# Patient Record
Sex: Female | Born: 1990 | Race: White | Hispanic: No | Marital: Single | Smoking: Never smoker
Health system: Southern US, Community
[De-identification: ages and names within clinical notes are randomized; demographics above are authoritative.]

## PROBLEM LIST (undated history)

## (undated) DIAGNOSIS — F4310 Post-traumatic stress disorder, unspecified: Secondary | ICD-10-CM

## (undated) DIAGNOSIS — R000 Tachycardia, unspecified: Secondary | ICD-10-CM

## (undated) DIAGNOSIS — C801 Malignant (primary) neoplasm, unspecified: Secondary | ICD-10-CM

## (undated) DIAGNOSIS — I10 Essential (primary) hypertension: Secondary | ICD-10-CM

## (undated) DIAGNOSIS — Z9889 Other specified postprocedural states: Secondary | ICD-10-CM

## (undated) DIAGNOSIS — Z9289 Personal history of other medical treatment: Secondary | ICD-10-CM

## (undated) DIAGNOSIS — F329 Major depressive disorder, single episode, unspecified: Secondary | ICD-10-CM

## (undated) DIAGNOSIS — F411 Generalized anxiety disorder: Secondary | ICD-10-CM

## (undated) DIAGNOSIS — E119 Type 2 diabetes mellitus without complications: Secondary | ICD-10-CM

## (undated) DIAGNOSIS — K760 Fatty (change of) liver, not elsewhere classified: Secondary | ICD-10-CM

## (undated) HISTORY — PX: SKIN CANCER EXCISION: SHX779

## (undated) HISTORY — PX: ABLATION: SHX5711

## (undated) HISTORY — PX: APPENDECTOMY: SHX54

---

## 2020-04-20 ENCOUNTER — Other Ambulatory Visit: Payer: Self-pay

## 2020-04-20 ENCOUNTER — Ambulatory Visit
Admission: EM | Admit: 2020-04-20 | Discharge: 2020-04-20 | Disposition: A | Discharge status: Home / Self Care | Payer: Self-pay

## 2020-04-20 ENCOUNTER — Encounter: Payer: Self-pay | Admitting: *Deleted

## 2020-04-20 DIAGNOSIS — R112 Nausea with vomiting, unspecified: Secondary | ICD-10-CM | POA: Insufficient documentation

## 2020-04-20 LAB — POCT URINALYSIS DIP (MANUAL ENTRY)
Glucose, UA: NEGATIVE mg/dL
Ketones, POC UA: NEGATIVE mg/dL
Nitrite, UA: NEGATIVE
Spec Grav, UA: >=1.03 — AB (ref 1.010–1.025)
Urobilinogen, UA: 0.2 E.U./dL
pH, UA: 6 (ref 5.0–8.0)

## 2020-04-20 MED ORDER — ONDANSETRON HCL 4 MG/2ML IJ SOLN
4.0000 mg | Freq: Once | INTRAMUSCULAR | Status: AC
  Administered 2020-04-20: 4 mg via INTRAMUSCULAR

## 2020-04-20 MED ORDER — ONDANSETRON 4 MG PO TBDP: 4.0000 mg | ORAL_TABLET | Freq: Three times a day (TID) | ORAL | 0 refills | Status: DC | PRN

## 2020-04-20 NOTE — ED Provider Notes (Signed)
[facility]    CSN: [id number] Arrival date & time: 04/20/20  1335      History   Chief Complaint Chief Complaint  Patient presents with  . Emesis    lower abdominal pain both sides, sore throat 2 days  . Chest Pain    HPI Mackenzie Whitaker is a 29 y.o. female.   29 year old female comes in for 2 day history of nausea, vomiting. States has had 20+ NBNB vomiting. Had diarrhea that has since resolved. Some dysuria without frequency, hematuria. No vaginal symptoms. Some abdominal pain associated with vomiting. Denies URI symptoms. No fever. LMP 03/31/2020, not currently sexually active.     History reviewed. No pertinent past medical history.  There are no problems to display for this patient.   Past Surgical History:  Procedure Laterality Date  . APPENDECTOMY      OB History   No obstetric history on file.      Home Medications    Prior to Admission medications   Medication Sig Start Date End Date Taking? Authorizing Provider  busPIRone (BUSPAR) 10 MG tablet Take 10 mg by mouth 2 (two) times daily.   Yes [provider]  citalopram (CELEXA) 20 MG tablet Take 20 mg by mouth daily.   Yes [provider]  ondansetron (ZOFRAN ODT) 4 MG disintegrating tablet Take 1 tablet (4 mg total) by mouth every 8 (eight) hours as needed for nausea or vomiting. 04/20/20   Ok Edwards, PA-C    Family History History reviewed. No pertinent family history.  Social History Social History   Tobacco Use  . Smoking status: Never Smoker  . Smokeless tobacco: Never Used  Vaping Use  . Vaping Use: Every day  Substance Use Topics  . Alcohol use: Never  . Drug use: Never     Allergies   Amoxicillin, Clindamycin/lincomycin, Wellbutrin [bupropion], and Zoloft [sertraline]   Review of Systems Review of Systems  Reason unable to perform ROS: See HPI as above.     Physical Exam Triage Vital Signs ED Triage Vitals  Enc Vitals Group     BP 04/20/20  1351 (!) 150/116     Pulse Rate 04/20/20 1351 (!) 118     Resp 04/20/20 1351 14     Temp 04/20/20 1351 98.1 F (36.7 C)     Temp Source 04/20/20 1351 Oral     SpO2 04/20/20 1351 97 %     Weight --      Height --      Head Circumference --      Peak Flow --      Pain Score 04/20/20 1355 3     Pain Loc --      Pain Edu? --      Excl. in [place]? --    No data found.  Updated Vital Signs BP (!) 150/116   Pulse (!) 118   Temp 98.1 F (36.7 C) (Oral)   Resp 14   LMP 03/31/2020   SpO2 97%   Physical Exam Constitutional:      General: She is not in acute distress.    Appearance: She is well-developed. She is not ill-appearing, toxic-appearing or diaphoretic.  HENT:     Head: Normocephalic and atraumatic.  Eyes:     Conjunctiva/sclera: Conjunctivae normal.     Pupils: Pupils are equal, round, and reactive to light.  Cardiovascular:     Rate and Rhythm: Regular rhythm. Tachycardia present.  Pulmonary:  Effort: Pulmonary effort is normal. No respiratory distress.     Comments: LCTAB Abdominal:     General: Bowel sounds are normal.     Palpations: Abdomen is soft.     Tenderness: There is no abdominal tenderness. There is no right CVA tenderness, left CVA tenderness, guarding or rebound.  Musculoskeletal:     Cervical back: Normal range of motion and neck supple.  Skin:    General: Skin is warm and dry.  Neurological:     Mental Status: She is alert and oriented to person, place, and time.  Psychiatric:        Behavior: Behavior normal.        Judgment: Judgment normal.      UC Treatments / Results  Labs (all labs ordered are listed, but only abnormal results are displayed) Labs Reviewed  POCT URINALYSIS DIP (MANUAL ENTRY) - Abnormal; Notable for the following components:      Result Value   Clarity, UA cloudy (*)    Bilirubin, UA small (*)    Spec Grav, UA >=1.030 (*)    Blood, UA trace-intact (*)    Protein Ur, POC trace (*)    Leukocytes, UA Large (3+) (*)      All other components within normal limits  URINE CULTURE    EKG   Radiology No results found.  Procedures Procedures (including critical care time)  Medications Ordered in UC Medications  ondansetron (ZOFRAN) injection 4 mg (4 mg Intramuscular Given 04/20/20 1457)  ondansetron (ZOFRAN) injection 4 mg (4 mg Intramuscular Given 04/20/20 1530)    Initial Impression / Assessment and Plan / UC Course  I have reviewed the triage vital signs and the nursing notes.  Pertinent labs & imaging results that were available during my care of the patient were reviewed by me and considered in my medical decision making (see chart for details).    Zofran 4mg  IM for symptoms without significant relief. Will attempt zofran 4mg  IM x 2, patient would like to rest at home. Urine dipstick with trace blood and large leuks. Will send for urine culture prior to treatment. Discussed if still unable to tolerate fluid intake, to go to the ED for further evaluation. Otherwise, zofran called in for further management if needed. Return precautions given.  Final Clinical Impressions(s) / UC Diagnoses   Final diagnoses:  Non-intractable vomiting with nausea, unspecified vomiting type   ED Prescriptions    Medication Sig Dispense Auth. Provider   ondansetron (ZOFRAN ODT) 4 MG disintegrating tablet Take 1 tablet (4 mg total) by mouth every 8 (eight) hours as needed for nausea or vomiting. 20 tablet Ok Edwards, PA-C     PDMP not reviewed this encounter.   Ok Edwards, PA-C 04/20/20 1608

## 2020-04-20 NOTE — ED Triage Notes (Signed)
Pt stated --vomiting and chest pain started 2 days ago. Tried drinking ginger ale not helping.

## 2020-04-20 NOTE — Discharge Instructions (Signed)
2 shots of zofran. If this starts working, you can continue zofran as needed. If still unable to keep anything down, go to the emergency department for further evaluation.

## 2020-04-24 LAB — URINE CULTURE: Culture: >=100000 — AB

## 2020-04-26 ENCOUNTER — Other Ambulatory Visit: Payer: Self-pay

## 2020-04-26 ENCOUNTER — Encounter (HOSPITAL_COMMUNITY): Payer: Self-pay | Admitting: Emergency Medicine

## 2020-04-26 ENCOUNTER — Emergency Department (HOSPITAL_COMMUNITY)
Admission: EM | Admit: 2020-04-26 | Discharge: 2020-04-27 | Disposition: A | Discharge status: Home / Self Care | Payer: Self-pay | Attending: Emergency Medicine | Admitting: Emergency Medicine

## 2020-04-26 DIAGNOSIS — R000 Tachycardia, unspecified: Secondary | ICD-10-CM | POA: Insufficient documentation

## 2020-04-26 DIAGNOSIS — F1210 Cannabis abuse, uncomplicated: Secondary | ICD-10-CM

## 2020-04-26 DIAGNOSIS — T50901A Poisoning by unspecified drugs, medicaments and biological substances, accidental (unintentional), initial encounter: Secondary | ICD-10-CM | POA: Insufficient documentation

## 2020-04-26 DIAGNOSIS — Z20822 Contact with and (suspected) exposure to covid-19: Secondary | ICD-10-CM | POA: Insufficient documentation

## 2020-04-26 MED ORDER — SODIUM CHLORIDE 0.9 % IV BOLUS
1000.0000 mL | Freq: Once | INTRAVENOUS | Status: AC
  Administered 2020-04-27: 1000 mL via INTRAVENOUS

## 2020-04-26 MED ORDER — LORAZEPAM 2 MG/ML IJ SOLN
1.0000 mg | Freq: Once | INTRAMUSCULAR | Status: AC
  Administered 2020-04-27: 1 mg via INTRAVENOUS
  Filled 2020-04-26: qty 1

## 2020-04-26 NOTE — ED Triage Notes (Signed)
Pt presents to ED from home BIB [facility]. Per pt she was smoking a THC vape, and that she was told by family she passed out, was tachycardic and HTN. Pt found in SVT by EMS highest rate 170s. EMS give 12mg  adenosine and 14ml IVF. BP - 160/80. Hx of same in past

## 2020-04-26 NOTE — ED Provider Notes (Signed)
[facility] EMERGENCY DEPARTMENT Provider Note   CSN: [id number] Arrival date & time: 04/26/20  2228     History Chief Complaint  Patient presents with  . Tachycardia    Mackenzie Whitaker is a 29 y.o. female.  Patient presents to the emergency department with a chief complaint of potential overdose.  She states that she received some bad news today, and took 7 "hits of [place]."  She states that shortly thereafter she passed out and reportedly had some convulsions.  She does not recall this.  She did not have any history of seizure.  EMS was called, and patient had heart rate in the 170s.  EMS gave 12 mg of adenosine.  Patient states that she has history of inappropriate sinus tachycardia and takes metoprolol, but has been out of her medication.  She states that she still feels anxious.  She denies any other associated symptoms.  She is noted to have mildly elevated temperature in triage of 100.1.  Denies suicidal thoughts, but states that she used the vape to "help with anxiety."  The history is provided by the patient. No language interpreter was used.       History reviewed. No pertinent past medical history.  There are no problems to display for this patient.   Past Surgical History:  Procedure Laterality Date  . APPENDECTOMY       OB History   No obstetric history on file.     History reviewed. No pertinent family history.  Social History   Tobacco Use  . Smoking status: Never Smoker  . Smokeless tobacco: Never Used  Vaping Use  . Vaping Use: Every day  Substance Use Topics  . Alcohol use: Never  . Drug use: Never    Home Medications Prior to Admission medications   Medication Sig Start Date End Date Taking? Authorizing Provider  metoprolol succinate (TOPROL-XL) 100 MG 24 hr tablet Take 100 mg by mouth every evening. Take with or immediately following a meal.   Yes [provider]  metoprolol tartrate (LOPRESSOR) 50 MG tablet Take  50 mg by mouth every morning.   Yes [provider]  busPIRone (BUSPAR) 10 MG tablet Take 10 mg by mouth 2 (two) times daily. Patient not taking: Reported on 04/26/2020    [provider]  citalopram (CELEXA) 20 MG tablet Take 20 mg by mouth daily. Patient not taking: Reported on 04/26/2020    [provider]  ondansetron (ZOFRAN ODT) 4 MG disintegrating tablet Take 1 tablet (4 mg total) by mouth every 8 (eight) hours as needed for nausea or vomiting. Patient not taking: Reported on 04/26/2020 04/20/20   Ok Edwards, PA-C    Allergies    Albuterol, Amoxicillin, Clindamycin/lincomycin, Other, Wellbutrin [bupropion], and Zoloft [sertraline]  Review of Systems   Review of Systems  All other systems reviewed and are negative.   Physical Exam Updated Vital Signs BP (!) 153/112   Pulse (!) 110   Temp 100.1 F (37.8 C) (Oral)   Resp 15   Ht 5\' 5"  (1.651 m)   Wt 127 kg   LMP 03/31/2020   SpO2 100%   BMI 46.59 kg/m   Physical Exam Vitals and nursing note reviewed.  Constitutional:      General: She is not in acute distress.    Appearance: She is well-developed.  HENT:     Head: Normocephalic and atraumatic.  Eyes:     Conjunctiva/sclera: Conjunctivae normal.  Cardiovascular:  Rate and Rhythm: Regular rhythm. Tachycardia present.     Heart sounds: No murmur heard.   Pulmonary:     Effort: Pulmonary effort is normal. No respiratory distress.     Breath sounds: Normal breath sounds.  Abdominal:     Palpations: Abdomen is soft.     Tenderness: There is no abdominal tenderness.  Musculoskeletal:        General: Normal range of motion.     Cervical back: Neck supple.  Skin:    General: Skin is warm and dry.  Neurological:     Mental Status: She is alert.  Psychiatric:     Comments: tearful     ED Results / Procedures / Treatments   Labs (all labs ordered are listed, but only abnormal results are displayed) Labs Reviewed  RESPIRATORY PANEL BY  RT PCR (FLU A&B, COVID)  COMPREHENSIVE METABOLIC PANEL  SALICYLATE LEVEL  ACETAMINOPHEN LEVEL  ETHANOL  RAPID URINE DRUG SCREEN, HOSP PERFORMED  CBC WITH DIFFERENTIAL/PLATELET  CBG MONITORING, ED  I-STAT BETA HCG BLOOD, ED ([facility], [facility], [facility] ONLY)    EKG None  Radiology No results found.  Procedures Procedures (including critical care time)  Medications Ordered in ED Medications  sodium chloride 0.9 % bolus 1,000 mL (has no administration in time range)  LORazepam (ATIVAN) injection 1 mg (has no administration in time range)    ED Course  I have reviewed the triage vital signs and the nursing notes.  Pertinent labs & imaging results that were available during my care of the patient were reviewed by me and considered in my medical decision making (see chart for details).    MDM Rules/Calculators/A&P                          Patient here with tachycardia and questionable seizure after using a THC vape.  Will check for coingestants.  Will give fluids and some Ativan.  Anticipate metabolizing to freedom.  Patient is overall well-appearing and nontoxic.  Given mildly elevated temperature, consider checking Covid.  2:40 AM Patient reassessed. Feeling improved.  HR coming down. Will refill patient's metoprolol.  Labs are reassuring.  No further emergent workup needed.  Final Clinical Impression(s) / ED Diagnoses Final diagnoses:  None    Rx / DC Orders ED Discharge Orders    None       Montine Circle, PA-C 04/27/20 0240    Fatima Blank, MD 04/27/20 [phone]

## 2020-04-27 LAB — RAPID URINE DRUG SCREEN, HOSP PERFORMED
Amphetamines: NOT DETECTED
Barbiturates: NOT DETECTED
Benzodiazepines: NOT DETECTED
Cocaine: NOT DETECTED
Opiates: NOT DETECTED
Tetrahydrocannabinol: POSITIVE — AB

## 2020-04-27 LAB — CBC WITH DIFFERENTIAL/PLATELET
Abs Immature Granulocytes: 0.06 10*3/uL (ref 0.00–0.07)
Basophils Absolute: 0.1 10*3/uL (ref 0.0–0.1)
Basophils Relative: 0 %
Eosinophils Absolute: 0 10*3/uL (ref 0.0–0.5)
Eosinophils Relative: 0 %
HCT: 47.5 % — ABNORMAL HIGH (ref 36.0–46.0)
Hemoglobin: 15.9 g/dL — ABNORMAL HIGH (ref 12.0–15.0)
Immature Granulocytes: 1 %
Lymphocytes Relative: 14 %
Lymphs Abs: 1.6 10*3/uL (ref 0.7–4.0)
MCH: 28.3 pg (ref 26.0–34.0)
MCHC: 33.5 g/dL (ref 30.0–36.0)
MCV: 84.5 fL (ref 80.0–100.0)
Monocytes Absolute: 0.7 10*3/uL (ref 0.1–1.0)
Monocytes Relative: 6 %
Neutro Abs: 9 10*3/uL — ABNORMAL HIGH (ref 1.7–7.7)
Neutrophils Relative %: 79 %
Platelets: 276 10*3/uL (ref 150–400)
RBC: 5.62 MIL/uL — ABNORMAL HIGH (ref 3.87–5.11)
RDW: 12.5 % (ref 11.5–15.5)
WBC: 11.4 10*3/uL — ABNORMAL HIGH (ref 4.0–10.5)
nRBC: 0 % (ref 0.0–0.2)

## 2020-04-27 LAB — ETHANOL: Alcohol, Ethyl (B): <10 mg/dL (ref <10)

## 2020-04-27 LAB — COMPREHENSIVE METABOLIC PANEL
ALT: 75 U/L — ABNORMAL HIGH (ref 0–44)
AST: 60 U/L — ABNORMAL HIGH (ref 15–41)
Albumin: 3.7 g/dL (ref 3.5–5.0)
Alkaline Phosphatase: 86 U/L (ref 38–126)
Anion gap: 12 (ref 5–15)
BUN: 12 mg/dL (ref 6–20)
CO2: 24 mmol/L (ref 22–32)
Calcium: 10.1 mg/dL (ref 8.9–10.3)
Chloride: 105 mmol/L (ref 98–111)
Creatinine, Ser: 0.87 mg/dL (ref 0.44–1.00)
GFR calc Af Amer: >60 mL/min (ref >60)
GFR calc non Af Amer: >60 mL/min (ref >60)
Glucose, Bld: 169 mg/dL — ABNORMAL HIGH (ref 70–99)
Potassium: 4 mmol/L (ref 3.5–5.1)
Sodium: 141 mmol/L (ref 135–145)
Total Bilirubin: 0.5 mg/dL (ref 0.3–1.2)
Total Protein: 6.6 g/dL (ref 6.5–8.1)

## 2020-04-27 LAB — I-STAT BETA HCG BLOOD, ED (MC, WL, AP ONLY): I-stat hCG, quantitative: <5 m[IU]/mL (ref <5)

## 2020-04-27 LAB — RESPIRATORY PANEL BY RT PCR (FLU A&B, COVID)
Influenza A by PCR: NEGATIVE
Influenza B by PCR: NEGATIVE
SARS Coronavirus 2 by RT PCR: NEGATIVE

## 2020-04-27 LAB — CBG MONITORING, ED: Glucose-Capillary: 181 mg/dL — ABNORMAL HIGH (ref 70–99)

## 2020-04-27 LAB — ACETAMINOPHEN LEVEL: Acetaminophen (Tylenol), Serum: <10 ug/mL — ABNORMAL LOW (ref 10–30)

## 2020-04-27 LAB — SALICYLATE LEVEL: Salicylate Lvl: <7 mg/dL — ABNORMAL LOW (ref 7.0–30.0)

## 2020-04-27 MED ORDER — METOPROLOL TARTRATE 50 MG PO TABS: 50.0000 mg | ORAL_TABLET | ORAL | 0 refills | Status: AC

## 2020-04-27 MED ORDER — METOPROLOL SUCCINATE ER 100 MG PO TB24: 100.0000 mg | ORAL_TABLET | Freq: Every evening | ORAL | 0 refills | Status: AC

## 2020-04-27 MED ORDER — SODIUM CHLORIDE 0.9 % IV BOLUS
1000.0000 mL | Freq: Once | INTRAVENOUS | Status: AC
  Administered 2020-04-27: 1000 mL via INTRAVENOUS

## 2020-04-27 NOTE — Discharge Instructions (Addendum)
Please follow-up with your regular doctor.   Return to the ER for new or worsening symptoms.

## 2020-04-27 NOTE — ED Notes (Signed)
Patient verbalizes understanding of discharge instructions. Prescriptions reviewed. Opportunity for questioning and answers were provided. Armband removed by staff, pt discharged from ED via wheelchair.

## 2020-09-05 ENCOUNTER — Emergency Department (HOSPITAL_COMMUNITY): Payer: Self-pay

## 2020-09-05 ENCOUNTER — Emergency Department (HOSPITAL_COMMUNITY)
Admission: EM | Admit: 2020-09-05 | Discharge: 2020-09-05 | Disposition: A | Discharge status: Home / Self Care | Payer: Self-pay | Attending: Emergency Medicine | Admitting: Emergency Medicine

## 2020-09-05 ENCOUNTER — Encounter (HOSPITAL_COMMUNITY): Payer: Self-pay | Admitting: Emergency Medicine

## 2020-09-05 ENCOUNTER — Other Ambulatory Visit: Payer: Self-pay

## 2020-09-05 DIAGNOSIS — K760 Fatty (change of) liver, not elsewhere classified: Secondary | ICD-10-CM | POA: Insufficient documentation

## 2020-09-05 DIAGNOSIS — R102 Pelvic and perineal pain: Secondary | ICD-10-CM

## 2020-09-05 DIAGNOSIS — Z20822 Contact with and (suspected) exposure to covid-19: Secondary | ICD-10-CM | POA: Insufficient documentation

## 2020-09-05 DIAGNOSIS — R0789 Other chest pain: Secondary | ICD-10-CM | POA: Insufficient documentation

## 2020-09-05 DIAGNOSIS — Z79899 Other long term (current) drug therapy: Secondary | ICD-10-CM | POA: Insufficient documentation

## 2020-09-05 DIAGNOSIS — Z85820 Personal history of malignant melanoma of skin: Secondary | ICD-10-CM | POA: Insufficient documentation

## 2020-09-05 HISTORY — DX: Malignant (primary) neoplasm, unspecified: C80.1

## 2020-09-05 HISTORY — DX: Tachycardia, unspecified: R00.0

## 2020-09-05 LAB — WET PREP, GENITAL
Clue Cells Wet Prep HPF POC: NONE SEEN
Sperm: NONE SEEN
Trich, Wet Prep: NONE SEEN
Yeast Wet Prep HPF POC: NONE SEEN

## 2020-09-05 LAB — HEPATIC FUNCTION PANEL
ALT: 71 U/L — ABNORMAL HIGH (ref 0–44)
AST: 53 U/L — ABNORMAL HIGH (ref 15–41)
Albumin: 4.1 g/dL (ref 3.5–5.0)
Alkaline Phosphatase: 97 U/L (ref 38–126)
Bilirubin, Direct: 0.1 mg/dL (ref 0.0–0.2)
Indirect Bilirubin: 0.5 mg/dL (ref 0.3–0.9)
Total Bilirubin: 0.6 mg/dL (ref 0.3–1.2)
Total Protein: 7.4 g/dL (ref 6.5–8.1)

## 2020-09-05 LAB — BASIC METABOLIC PANEL
Anion gap: 9 (ref 5–15)
BUN: 12 mg/dL (ref 6–20)
CO2: 27 mmol/L (ref 22–32)
Calcium: 9.7 mg/dL (ref 8.9–10.3)
Chloride: 105 mmol/L (ref 98–111)
Creatinine, Ser: 0.76 mg/dL (ref 0.44–1.00)
GFR, Estimated: >60 mL/min (ref >60)
Glucose, Bld: 149 mg/dL — ABNORMAL HIGH (ref 70–99)
Potassium: 4.2 mmol/L (ref 3.5–5.1)
Sodium: 141 mmol/L (ref 135–145)

## 2020-09-05 LAB — URINALYSIS, ROUTINE W REFLEX MICROSCOPIC
Bilirubin Urine: NEGATIVE
Glucose, UA: NEGATIVE mg/dL
Ketones, ur: NEGATIVE mg/dL
Nitrite: NEGATIVE
Protein, ur: 30 mg/dL — AB
RBC / HPF: >50 RBC/hpf — ABNORMAL HIGH (ref 0–5)
Specific Gravity, Urine: 1.025 (ref 1.005–1.030)
pH: 5 (ref 5.0–8.0)

## 2020-09-05 LAB — CBC
HCT: 48.1 % — ABNORMAL HIGH (ref 36.0–46.0)
Hemoglobin: 16.5 g/dL — ABNORMAL HIGH (ref 12.0–15.0)
MCH: 29.2 pg (ref 26.0–34.0)
MCHC: 34.3 g/dL (ref 30.0–36.0)
MCV: 85 fL (ref 80.0–100.0)
Platelets: 295 10*3/uL (ref 150–400)
RBC: 5.66 MIL/uL — ABNORMAL HIGH (ref 3.87–5.11)
RDW: 12.9 % (ref 11.5–15.5)
WBC: 7.9 10*3/uL (ref 4.0–10.5)
nRBC: 0 % (ref 0.0–0.2)

## 2020-09-05 LAB — HIV ANTIBODY (ROUTINE TESTING W REFLEX): HIV Screen 4th Generation wRfx: NONREACTIVE

## 2020-09-05 LAB — I-STAT BETA HCG BLOOD, ED (MC, WL, AP ONLY): I-stat hCG, quantitative: <5 m[IU]/mL (ref <5)

## 2020-09-05 LAB — TROPONIN I (HIGH SENSITIVITY): Troponin I (High Sensitivity): <2 ng/L (ref <18)

## 2020-09-05 MED ORDER — ACETAMINOPHEN 325 MG PO TABS
650.0000 mg | ORAL_TABLET | Freq: Once | ORAL | Status: AC
  Administered 2020-09-05: 650 mg via ORAL
  Filled 2020-09-05: qty 2

## 2020-09-05 MED ORDER — ONDANSETRON HCL 4 MG/2ML IJ SOLN
4.0000 mg | Freq: Once | INTRAMUSCULAR | Status: AC
  Administered 2020-09-05: 4 mg via INTRAVENOUS
  Filled 2020-09-05: qty 2

## 2020-09-05 MED ORDER — KETOROLAC TROMETHAMINE 30 MG/ML IJ SOLN
30.0000 mg | Freq: Once | INTRAMUSCULAR | Status: AC
  Administered 2020-09-05: 30 mg via INTRAVENOUS
  Filled 2020-09-05: qty 1

## 2020-09-05 MED ORDER — IOHEXOL 300 MG/ML  SOLN
100.0000 mL | Freq: Once | INTRAMUSCULAR | Status: AC | PRN
  Administered 2020-09-05: 100 mL via INTRAVENOUS

## 2020-09-05 MED ORDER — SODIUM CHLORIDE 0.9 % IV BOLUS
1000.0000 mL | Freq: Once | INTRAVENOUS | Status: AC
  Administered 2020-09-05: 1000 mL via INTRAVENOUS

## 2020-09-05 MED ORDER — ONDANSETRON 4 MG PO TBDP: 4.0000 mg | ORAL_TABLET | Freq: Three times a day (TID) | ORAL | 0 refills | Status: AC | PRN

## 2020-09-05 NOTE — ED Provider Notes (Signed)
[facility] DEPT Provider Note   CSN: [id number] Arrival date & time: 09/05/20  1407     History Chief Complaint  Patient presents with  . Emesis  . Diarrhea  . Pelvic Pain  . Chest Pain    Mackenzie Whitaker is a 30 y.o. female with past medical history significant for melanoma and sinus tachycardia.  Abdominal surgical history includes appendectomy.  HPI Presents to emergency department today with chief complaint of multiple symptoms.  She is endorsing chest pain, pelvic pain, diarrhea and emesis x2 days.  She describes her chest pain as a tightness feeling that has been intermittent.  She is describing her pelvic pain as strong cramping and stabbing sensation that is located on both sides of pelvis stating it feels like it is in her ovaries. She denies history of similar pain.  She endorses 6 episodes of nonbloody nonbilious emesis in the last x24 hours.  She has been unable to tolerate much p.o. intake besides sips of water.  She states her menstrual cycle has been irregular x several years. She denies being on cycle currently although admits to scant blood on the tissue when wiping. Denies fever, chills, shortness of breath, cough, hemoptysis, syncope, palpitations, vaginal discharge.  She is requesting STD testing.  Denies family history of cardiac disease.    Past Medical History:  Diagnosis Date  . Cancer ([place])    melanoma  . Sinus tachycardia     There are no problems to display for this patient.   Past Surgical History:  Procedure Laterality Date  . ABLATION    . APPENDECTOMY       OB History   No obstetric history on file.     Family History  Problem Relation Age of Onset  . Cancer Father   . Hypertension Father   . Diabetes Father   . High Cholesterol Father     Social History   Tobacco Use  . Smoking status: Never Smoker  . Smokeless tobacco: Never Used  Vaping Use  . Vaping Use: Former  Substance Use Topics  . Alcohol  use: Never  . Drug use: Not Currently    Home Medications Prior to Admission medications   Medication Sig Start Date End Date Taking? Authorizing Provider  COLLAGEN PO Take 1 capsule by mouth daily.   Yes [provider]  metoprolol succinate (TOPROL-XL) 100 MG 24 hr tablet Take 1 tablet (100 mg total) by mouth every evening. Take with or immediately following a meal. Patient taking differently: Take 100 mg by mouth in the morning. Take with or immediately following a meal. 04/27/20  Yes Montine Circle, PA-C  metoprolol tartrate (LOPRESSOR) 50 MG tablet Take 1 tablet (50 mg total) by mouth every morning. Patient taking differently: Take 50 mg by mouth 2 (two) times daily. 04/27/20  Yes Montine Circle, PA-C  ondansetron (ZOFRAN ODT) 4 MG disintegrating tablet Take 1 tablet (4 mg total) by mouth every 8 (eight) hours as needed for nausea or vomiting. 09/05/20  Yes Walisiewicz, Verline Lema E, PA-C    Allergies    Albuterol, Amoxicillin, Clindamycin/lincomycin, Drug ingredient [cephalexin], Other, Wellbutrin [bupropion], and Zoloft [sertraline]  Review of Systems   Review of Systems All other systems are reviewed and are negative for acute change except as noted in the HPI.  Physical Exam Updated Vital Signs BP (!) 164/100 (BP Location: Left Arm)   Pulse (!) 109   Temp 99 F (37.2 C) (Oral)   Resp 20  Ht 5' 5.5" (1.664 m)   Wt 133.8 kg   SpO2 98%   BMI 48.34 kg/m   Physical Exam Vitals and nursing note reviewed.  Constitutional:      General: She is not in acute distress.    Appearance: She is obese. She is not ill-appearing.  HENT:     Head: Normocephalic and atraumatic.     Right Ear: Tympanic membrane and external ear normal.     Left Ear: Tympanic membrane and external ear normal.     Nose: Nose normal.     Mouth/Throat:     Mouth: Mucous membranes are moist.     Pharynx: Oropharynx is clear.  Eyes:     General: No scleral icterus.       Right eye: No  discharge.        Left eye: No discharge.     Extraocular Movements: Extraocular movements intact.     Conjunctiva/sclera: Conjunctivae normal.     Pupils: Pupils are equal, round, and reactive to light.  Neck:     Vascular: No JVD.  Cardiovascular:     Rate and Rhythm: Regular rhythm. Tachycardia present.     Pulses: Normal pulses.          Radial pulses are 2+ on the right side and 2+ on the left side.     Heart sounds: Normal heart sounds.  Pulmonary:     Comments: Lungs clear to auscultation in all fields. Symmetric chest rise. No wheezing, rales, or rhonchi. Abdominal:     General: Bowel sounds are normal.     Tenderness: There is no right CVA tenderness or left CVA tenderness.     Comments: Abdomen is soft, non-distended, with tenderness to bilateral lower quadrants. No rigidity, no guarding. No peritoneal signs.  Genitourinary:    Comments: Normal external genitalia. No pain with speculum insertion. Closed cervical os with normal appearance - no rash or lesions. Blood seen in vaginal vault with signs of hemorrhage. No significant dischargenoted from cervix or in vaginal vault. On bimanual examination no adnexal tenderness or cervical motion tenderness. Chaperone [place] RN present during exam.  Musculoskeletal:        General: Normal range of motion.     Cervical back: Normal range of motion.  Skin:    General: Skin is warm and dry.     Capillary Refill: Capillary refill takes less than 2 seconds.  Neurological:     Mental Status: She is oriented to person, place, and time.     GCS: GCS eye subscore is 4. GCS verbal subscore is 5. GCS motor subscore is 6.     Comments: Fluent speech, no facial droop.  Psychiatric:        Behavior: Behavior normal.     ED Results / Procedures / Treatments   Labs (all labs ordered are listed, but only abnormal results are displayed) Labs Reviewed  WET PREP, GENITAL - Abnormal; Notable for the following components:      Result Value    WBC, Wet Prep HPF POC FEW (*)    All other components within normal limits  BASIC METABOLIC PANEL - Abnormal; Notable for the following components:   Glucose, Bld 149 (*)    All other components within normal limits  CBC - Abnormal; Notable for the following components:   RBC 5.66 (*)    Hemoglobin 16.5 (*)    HCT 48.1 (*)    All other components within normal limits  URINALYSIS, ROUTINE W  REFLEX MICROSCOPIC - Abnormal; Notable for the following components:   Color, Urine AMBER (*)    APPearance HAZY (*)    Hgb urine dipstick LARGE (*)    Protein, ur 30 (*)    Leukocytes,Ua SMALL (*)    RBC / HPF >50 (*)    Bacteria, UA RARE (*)    All other components within normal limits  HEPATIC FUNCTION PANEL - Abnormal; Notable for the following components:   AST 53 (*)    ALT 71 (*)    All other components within normal limits  SARS CORONAVIRUS 2 (TAT 6-24 HRS)  RPR  HIV ANTIBODY (ROUTINE TESTING W REFLEX)  I-STAT BETA HCG BLOOD, ED ([facility], [facility], [facility] ONLY)  GC/CHLAMYDIA PROBE AMP ([place]) NOT AT [facility]  TROPONIN I (HIGH SENSITIVITY)    EKG EKG Interpretation  Date/Time:  Thursday September 05 2020 14:18:22 EST Ventricular Rate:  136 PR Interval:    QRS Duration: 85 QT Interval:  299 QTC Calculation: 450 R Axis:   83 Text Interpretation: Sinus tachycardia Low voltage, precordial leads Borderline T abnormalities, diffuse leads 12 Lead; [place] Since last tracing rate faster Confirmed by Linwood Dibbles [phone]) on 09/05/2020 6:49:26 PM   Radiology DG Chest 2 View  Result Date: 09/05/2020 CLINICAL DATA:  30 year old female with chest pain and tachycardia EXAM: CHEST - 2 VIEW COMPARISON:  None. FINDINGS: The heart size and mediastinal contours are within normal limits. Both lungs are clear. The visualized skeletal structures are unremarkable. IMPRESSION: No active cardiopulmonary disease. Electronically Signed   By: Elgie Collard M.D.   On: 09/05/2020 16:17   CT Abdomen Pelvis W  Contrast  Result Date: 09/05/2020 CLINICAL DATA:  Nonlocalized abdominal pain EXAM: CT ABDOMEN AND PELVIS WITH CONTRAST TECHNIQUE: Multidetector CT imaging of the abdomen and pelvis was performed using the standard protocol following bolus administration of intravenous contrast. CONTRAST:  OMNIPAQUE IOHEXOL 300 MG/ML  SOLN COMPARISON:  None. FINDINGS: Lower chest: The visualized heart size within normal limits. No pericardial fluid/thickening. No hiatal hernia. The visualized portions of the lungs are clear. Hepatobiliary: There is diffuse low density seen throughout the liver parenchyma. Mild hepatomegaly is noted. The main portal vein is patent. No evidence of calcified gallstones, gallbladder wall thickening or biliary dilatation. Pancreas: Unremarkable. No pancreatic ductal dilatation or surrounding inflammatory changes. Spleen: Normal in size without focal abnormality. Adrenals/Urinary Tract: Both adrenal glands appear normal. Punctate calcifications are seen within the mid to lower pole of the left kidney the largest measuring 2 mm within the lower pole. There is also punctate calcifications seen within the lower pole of the right kidney. No collecting system calculi are noted. Bladder is unremarkable. Stomach/Bowel: The stomach, small bowel, and colon are normal in appearance. No inflammatory changes, wall thickening, or obstructive findings.The patient is status post appendectomy. Vascular/Lymphatic: There are no enlarged mesenteric, retroperitoneal, or pelvic lymph nodes. No significant vascular findings are present. Reproductive: The uterus and adnexa are unremarkable. Other: No evidence of abdominal wall mass or hernia. Musculoskeletal: No acute or significant osseous findings. IMPRESSION: Hepatic steatosis and mild hepatomegaly Punctate nonobstructing bilateral renal calculi Electronically Signed   By: Jonna Clark M.D.   On: 09/05/2020 18:18   US PELVIC COMPLETE W TRANSVAGINAL AND TORSION  R/O  Result Date: 09/05/2020 CLINICAL DATA:  Pelvic pain, LMP 07/03/2020 EXAM: TRANSABDOMINAL AND TRANSVAGINAL ULTRASOUND OF PELVIS TECHNIQUE: Both transabdominal and transvaginal ultrasound examinations of the pelvis were performed. Transabdominal technique was performed for global imaging of the pelvis including  uterus, ovaries, adnexal regions, and pelvic cul-de-sac. It was necessary to proceed with endovaginal exam following the transabdominal exam to visualize the uterus, endometrium, and ovaries. COMPARISON:  None FINDINGS: Uterus Measurements: 8.9 x 4.2 x 4.6 cm = volume: 90 mL. Anteverted. Heterogeneous myometrium. No focal mass. Nabothian cysts noted at cervix. Endometrium Thickness: 9 mm.  No endometrial fluid or focal abnormality Right ovary Measurements: 2.5 x 1.9 x 1.8 cm = volume: 4.3 mL. Normal morphology without mass Left ovary Measurements: 2.8 x 1.7 x 2.7 cm = volume: 6.5 mL. Normal morphology without mass Other findings No free pelvic fluid.  No adnexal masses. IMPRESSION: Normal exam. Electronically Signed   By: Ulyses Southward M.D.   On: 09/05/2020 15:47    Procedures Procedures   Medications Ordered in ED Medications  ondansetron (ZOFRAN) injection 4 mg (4 mg Intravenous Given 09/05/20 1546)  sodium chloride 0.9 % bolus 1,000 mL (0 mLs Intravenous Stopped 09/05/20 1900)  acetaminophen (TYLENOL) tablet 650 mg (650 mg Oral Given 09/05/20 1555)  iohexol (OMNIPAQUE) 300 MG/ML solution 100 mL (100 mLs Intravenous Contrast Given 09/05/20 1759)  ketorolac (TORADOL) 30 MG/ML injection 30 mg (30 mg Intravenous Given 09/05/20 1931)    ED Course  I have reviewed the triage vital signs and the nursing notes.  Pertinent labs & imaging results that were available during my care of the patient were reviewed by me and considered in my medical decision making (see chart for details).    MDM Rules/Calculators/A&P                          History provided by patient with additional history obtained  from chart review.    Patient presents to the ED with complaints of abdominal pain. Patient nontoxic appearing, in no apparent distress.  Patient noted to be tachycardic in triage to 124.  During my exam heart rate is ranging from  110-120.  Patient admitted to incidental sinus tachycardia. On exam patient tender to bilateral lower quadrants, no peritoneal signs. Anti-emetics, and fluids administered.  She declines need for opioid analgesic, for Tylenol was given some symptom improvement.  EKG shows sinus tachycardia.  Heart score of 1.  Troponin was undetectable, with reassuring EKG feel that ACS is less likely cause of pain. I viewed pt's chest xray and it does not suggest acute infectious processes.  CBC without leukocytosis, does appear hemoconcentrated, possibly related to dehydration.  No significant electrolyte derangement, no renal insufficiency, does have mildly elevated liver enzymes AST 53 and ALT 71 chart review shows she has history of the same.  Pregnancy test is negative.  UA without signs of infection, she does have large hemoglobinuria and over 50 RBC.  Pelvic ultrasound was performed and is unremarkable.  No signs ovarian torsion ovarian cyst.  After the ultrasound was performed patient admitted vaginal bleeding was heavier than it was earlier today.  This is potentially her menstrual starting as it is irregular and last was x 1 month ago. Pelvic exam performed with chaperone present. Blood seen in vaginal vault without signs of hemorrhage.  No cervical motion or adnexal tenderness, low suspicion for PID.  Wet prep only showing few WBC.  GC probe collected.  Patient aware she will need to be treated if results are positive and wants to hold off on prophylactic treatment.  When reassessing patient she continued to have abdominal tenderness on exam.  Proceeded with CT abdomen pelvis which shows hepatic steatosis and mild  hepatomegaly.  No acute findings.  On repeat abdominal exam patient  remains without peritoneal signs. Patient tolerating PO in the emergency department.  She is pain-free after Toradol.  Will discharge home with supportive measures. I discussed results, treatment plan, need for PCP  And GI follow-up, and return precautions with the patient. Provided opportunity for questions, patient confirmed understanding and is in agreement with plan.  Patient has Covid test in process.  She is aware she is to self quarantine till she has the results and discuss what to do if test is positive.  Zissel Biederman was evaluated in Emergency Department on 09/05/2020 for the symptoms described in the history of present illness. She was evaluated in the context of the global COVID-19 pandemic, which necessitated consideration that the patient might be at risk for infection with the SARS-CoV-2 virus that causes COVID-19. Institutional protocols and algorithms that pertain to the evaluation of patients at risk for COVID-19 are in a state of rapid change based on information released by regulatory bodies including the CDC and federal and state organizations. These policies and algorithms were followed during the patient's care in the ED.   Portions of this note were generated with [employer]. Dictation errors may occur despite best attempts at proofreading.   Final Clinical Impression(s) / ED Diagnoses Final diagnoses:  Pelvic pain  Hepatic steatosis    Rx / DC Orders ED Discharge Orders         Ordered    ondansetron (ZOFRAN ODT) 4 MG disintegrating tablet  Every 8 hours PRN        09/05/20 1903           Barrie Folk, PA-C 09/05/20 2023    Dorie Rank, MD 09/08/20 1627

## 2020-09-05 NOTE — ED Notes (Signed)
Pt aware we need urine sample.

## 2020-09-05 NOTE — ED Notes (Signed)
Given PO fluids for PO challenge.

## 2020-09-05 NOTE — Discharge Instructions (Addendum)
-  The CT scan of your abdomen today showed you have what looks to be hepatic steatosis, the medical term for fatty liver.  Your liver enzymes were slightly elevated today and it looks like they have been in the past.  You should follow up outpatient with Gastrointestinal doctors for further evaluation of this. Call [facility] to schedule emergency room follow up visit.   Your Covid test is in process.  If it is positive you will need to quarantine per CDC guidelines.  -Your pelvic ultrasound not show any abnormalities or ovarian cyst.  -Prescription for Zofran sent to your pharmacy.  This is for nausea, you take as needed.  -I have included information about [facility] in your discharge paperwork.  You can try to call their office to schedule follow-up appointment as well.  Return to the emergency department if you have any new or worsening symptoms.

## 2020-09-05 NOTE — ED Triage Notes (Signed)
patient c/o pelvic pain. N/v, and chest tightness since yesterday and diarrhea. Patient states the pelvic pain and diarhea are worse today.

## 2020-09-06 ENCOUNTER — Encounter (INDEPENDENT_AMBULATORY_CARE_PROVIDER_SITE_OTHER): Payer: Self-pay

## 2020-09-06 LAB — GC/CHLAMYDIA PROBE AMP (~~LOC~~) NOT AT ARMC
Chlamydia: NEGATIVE
Comment: NEGATIVE
Comment: NORMAL
Neisseria Gonorrhea: NEGATIVE

## 2020-09-06 LAB — RPR: RPR Ser Ql: NONREACTIVE

## 2020-09-06 LAB — SARS CORONAVIRUS 2 (TAT 6-24 HRS): SARS Coronavirus 2: NEGATIVE

## 2020-09-12 ENCOUNTER — Other Ambulatory Visit: Payer: Self-pay

## 2020-09-12 ENCOUNTER — Emergency Department (HOSPITAL_COMMUNITY)
Admission: EM | Admit: 2020-09-12 | Discharge: 2020-09-13 | Disposition: A | Discharge status: Other Acute Inpatient Hospital | Payer: Self-pay | Attending: Emergency Medicine | Admitting: Emergency Medicine

## 2020-09-12 ENCOUNTER — Encounter (HOSPITAL_COMMUNITY): Payer: Self-pay

## 2020-09-12 ENCOUNTER — Telehealth: Payer: Self-pay | Admitting: General Practice

## 2020-09-12 DIAGNOSIS — S41112A Laceration without foreign body of left upper arm, initial encounter: Secondary | ICD-10-CM | POA: Insufficient documentation

## 2020-09-12 DIAGNOSIS — S41111A Laceration without foreign body of right upper arm, initial encounter: Secondary | ICD-10-CM | POA: Insufficient documentation

## 2020-09-12 DIAGNOSIS — T1491XA Suicide attempt, initial encounter: Secondary | ICD-10-CM | POA: Insufficient documentation

## 2020-09-12 DIAGNOSIS — F332 Major depressive disorder, recurrent severe without psychotic features: Secondary | ICD-10-CM | POA: Insufficient documentation

## 2020-09-12 DIAGNOSIS — Z23 Encounter for immunization: Secondary | ICD-10-CM | POA: Insufficient documentation

## 2020-09-12 DIAGNOSIS — Z20822 Contact with and (suspected) exposure to covid-19: Secondary | ICD-10-CM | POA: Insufficient documentation

## 2020-09-12 DIAGNOSIS — R578 Other shock: Secondary | ICD-10-CM | POA: Insufficient documentation

## 2020-09-12 DIAGNOSIS — S41119A Laceration without foreign body of unspecified upper arm, initial encounter: Secondary | ICD-10-CM

## 2020-09-12 DIAGNOSIS — X789XXA Intentional self-harm by unspecified sharp object, initial encounter: Secondary | ICD-10-CM | POA: Insufficient documentation

## 2020-09-12 LAB — CBC
HCT: 42.6 % (ref 36.0–46.0)
HCT: 42.7 % (ref 36.0–46.0)
Hemoglobin: 13.9 g/dL (ref 12.0–15.0)
Hemoglobin: 14.5 g/dL (ref 12.0–15.0)
MCH: 28.5 pg (ref 26.0–34.0)
MCH: 29.2 pg (ref 26.0–34.0)
MCHC: 32.6 g/dL (ref 30.0–36.0)
MCHC: 34 g/dL (ref 30.0–36.0)
MCV: 87.5 fL (ref 80.0–100.0)
MCV: 85.9 fL (ref 80.0–100.0)
Platelets: 251 10*3/uL (ref 150–400)
Platelets: 270 10*3/uL (ref 150–400)
RBC: 4.87 MIL/uL (ref 3.87–5.11)
RBC: 4.97 MIL/uL (ref 3.87–5.11)
RDW: 13.2 % (ref 11.5–15.5)
RDW: 14.1 % (ref 11.5–15.5)
WBC: 11.1 10*3/uL — ABNORMAL HIGH (ref 4.0–10.5)
WBC: 11.3 10*3/uL — ABNORMAL HIGH (ref 4.0–10.5)
nRBC: 0 % (ref 0.0–0.2)
nRBC: 0 % (ref 0.0–0.2)

## 2020-09-12 LAB — RAPID URINE DRUG SCREEN, HOSP PERFORMED
Amphetamines: NOT DETECTED
Barbiturates: NOT DETECTED
Benzodiazepines: NOT DETECTED
Cocaine: NOT DETECTED
Opiates: NOT DETECTED
Tetrahydrocannabinol: NOT DETECTED

## 2020-09-12 LAB — PROTIME-INR
INR: 1.2 (ref 0.8–1.2)
Prothrombin Time: 14.7 seconds (ref 11.4–15.2)

## 2020-09-12 LAB — URINALYSIS, ROUTINE W REFLEX MICROSCOPIC
Glucose, UA: NEGATIVE mg/dL
Ketones, ur: 15 mg/dL — AB
Leukocytes,Ua: NEGATIVE
Nitrite: NEGATIVE
Protein, ur: NEGATIVE mg/dL
Specific Gravity, Urine: >1.03 — ABNORMAL HIGH (ref 1.005–1.030)
pH: 5 (ref 5.0–8.0)

## 2020-09-12 LAB — URINALYSIS, MICROSCOPIC (REFLEX)

## 2020-09-12 LAB — I-STAT CHEM 8, ED
BUN: 16 mg/dL (ref 6–20)
Calcium, Ion: 1.18 mmol/L (ref 1.15–1.40)
Chloride: 105 mmol/L (ref 98–111)
Creatinine, Ser: 0.9 mg/dL (ref 0.44–1.00)
Glucose, Bld: 208 mg/dL — ABNORMAL HIGH (ref 70–99)
HCT: 40 % (ref 36.0–46.0)
Hemoglobin: 13.6 g/dL (ref 12.0–15.0)
Potassium: 3.7 mmol/L (ref 3.5–5.1)
Sodium: 140 mmol/L (ref 135–145)
TCO2: 22 mmol/L (ref 22–32)

## 2020-09-12 LAB — RESP PANEL BY RT-PCR (FLU A&B, COVID) ARPGX2
Influenza A by PCR: NEGATIVE
Influenza B by PCR: NEGATIVE
SARS Coronavirus 2 by RT PCR: NEGATIVE

## 2020-09-12 LAB — COMPREHENSIVE METABOLIC PANEL
ALT: 88 U/L — ABNORMAL HIGH (ref 0–44)
AST: 66 U/L — ABNORMAL HIGH (ref 15–41)
Albumin: 3.5 g/dL (ref 3.5–5.0)
Alkaline Phosphatase: 80 U/L (ref 38–126)
Anion gap: 13 (ref 5–15)
BUN: 15 mg/dL (ref 6–20)
CO2: 20 mmol/L — ABNORMAL LOW (ref 22–32)
Calcium: 9.5 mg/dL (ref 8.9–10.3)
Chloride: 104 mmol/L (ref 98–111)
Creatinine, Ser: 1.06 mg/dL — ABNORMAL HIGH (ref 0.44–1.00)
GFR, Estimated: >60 mL/min (ref >60)
Glucose, Bld: 209 mg/dL — ABNORMAL HIGH (ref 70–99)
Potassium: 3.7 mmol/L (ref 3.5–5.1)
Sodium: 137 mmol/L (ref 135–145)
Total Bilirubin: 1 mg/dL (ref 0.3–1.2)
Total Protein: 6.1 g/dL — ABNORMAL LOW (ref 6.5–8.1)

## 2020-09-12 LAB — ETHANOL: Alcohol, Ethyl (B): <10 mg/dL (ref <10)

## 2020-09-12 LAB — ABO/RH: ABO/RH(D): O POS

## 2020-09-12 LAB — BLOOD PRODUCT ORDER (VERBAL) VERIFICATION

## 2020-09-12 LAB — LACTIC ACID, PLASMA: Lactic Acid, Venous: 2.3 mmol/L (ref 0.5–1.9)

## 2020-09-12 LAB — I-STAT BETA HCG BLOOD, ED (MC, WL, AP ONLY): I-stat hCG, quantitative: <5 m[IU]/mL (ref <5)

## 2020-09-12 MED ORDER — LIDOCAINE HCL (PF) 1 % IJ SOLN
INTRAMUSCULAR | Status: AC
  Filled 2020-09-12: qty 30

## 2020-09-12 MED ORDER — TETANUS-DIPHTH-ACELL PERTUSSIS 5-2.5-18.5 LF-MCG/0.5 IM SUSY
0.5000 mL | PREFILLED_SYRINGE | Freq: Once | INTRAMUSCULAR | Status: AC
  Administered 2020-09-12: 0.5 mL via INTRAMUSCULAR
  Filled 2020-09-12: qty 0.5

## 2020-09-12 MED ORDER — LORAZEPAM 1 MG PO TABS: 1.0000 mg | ORAL_TABLET | Freq: Three times a day (TID) | ORAL | Status: DC | PRN

## 2020-09-12 MED ORDER — SODIUM CHLORIDE 0.9 % IV BOLUS
1000.0000 mL | Freq: Once | INTRAVENOUS | Status: AC
  Administered 2020-09-12: 1000 mL via INTRAVENOUS

## 2020-09-12 MED ORDER — ACETAMINOPHEN 325 MG PO TABS: 650.0000 mg | ORAL_TABLET | ORAL | Status: DC | PRN

## 2020-09-12 MED ORDER — ONDANSETRON HCL 4 MG PO TABS: 4.0000 mg | ORAL_TABLET | Freq: Three times a day (TID) | ORAL | Status: DC | PRN
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
## 2020-09-12 NOTE — ED Provider Notes (Addendum)
[facility] Emergency Department Provider Note MRN:  [id number]  Arrival date & time: 09/12/20     Chief Complaint   Suicide Attempt   History of Present Illness   Mackenzie Whitaker is a 30 y.o. year-old female with unknown past medical history presenting to the ED with chief complaint of suicide.  Box cutter used to try to kill herself, cutting bilateral antecubital fossa.  Significant bleeding on the scene.  Patient is altered, denies drug use.  I was unable to obtain an accurate HPI, PMH, or ROS due to the patient's altered mental status.  Level 5 caveat.  Review of Systems  Positive for suicide attempt, laceration, altered mental status.  Patient's Health History   History reviewed. No pertinent past medical history.  History reviewed. No pertinent surgical history.  No family history on file.  Social History   Socioeconomic History  . Marital status: Single    Spouse name: Not on file  . Number of children: Not on file  . Years of education: Not on file  . Highest education level: Not on file  Occupational History  . Not on file  Tobacco Use  . Smoking status: Not on file  . Smokeless tobacco: Not on file  Substance and Sexual Activity  . Alcohol use: Not on file  . Drug use: Not on file  . Sexual activity: Not on file  Other Topics Concern  . Not on file  Social History Narrative  . Not on file   Social Determinants of Health   Financial Resource Strain: Not on file  Food Insecurity: Not on file  Transportation Needs: Not on file  Physical Activity: Not on file  Stress: Not on file  Social Connections: Not on file  Intimate Partner Violence: Not on file     Physical Exam   Vitals:   09/12/20 0530 09/12/20 0615  BP: 131/89 (!) 134/95  Pulse: (!) 124 (!) 118  Resp: 10 14  Temp:    SpO2: 98% 98%    CONSTITUTIONAL: Well-appearing, NAD NEURO:  Alert and oriented x 3, no focal deficits EYES:  eyes equal and reactive ENT/NECK:   no LAD, no JVD CARDIO: Regular rate, well-perfused, normal S1 and S2 PULM:  CTAB no wheezing or rhonchi GI/GU:  normal bowel sounds, non-distended, non-tender MSK/SPINE:  No gross deformities, no edema SKIN:  lacerations to bilateral antecubital fossa PSYCH:  Appropriate speech and behavior  *Additional and/or pertinent findings included in MDM below  Diagnostic and Interventional Summary    EKG Interpretation  Date/Time:    Ventricular Rate:    PR Interval:    QRS Duration:   QT Interval:    QTC Calculation:   R Axis:     Text Interpretation:        Labs Reviewed  COMPREHENSIVE METABOLIC PANEL - Abnormal; Notable for the following components:      Result Value   CO2 20 (*)    Glucose, Bld 209 (*)    Creatinine, Ser 1.06 (*)    Total Protein 6.1 (*)    AST 66 (*)    ALT 88 (*)    All other components within normal limits  CBC - Abnormal; Notable for the following components:   WBC 11.1 (*)    All other components within normal limits  LACTIC ACID, PLASMA - Abnormal; Notable for the following components:   Lactic Acid, Venous 2.3 (*)    All other components within normal limits  I-STAT CHEM 8,  ED - Abnormal; Notable for the following components:   Glucose, Bld 208 (*)    All other components within normal limits  RESP PANEL BY RT-PCR (FLU A&B, COVID) ARPGX2  ETHANOL  PROTIME-INR  URINALYSIS, ROUTINE W REFLEX MICROSCOPIC  SALICYLATE LEVEL  ACETAMINOPHEN LEVEL  RAPID URINE DRUG SCREEN, HOSP PERFORMED  CBC  TYPE AND SCREEN  SAMPLE TO BLOOD BANK  ABO/RH    No orders to display    Medications  lidocaine (PF) (XYLOCAINE) 1 % injection (has no administration in time range)  Tdap (BOOSTRIX) injection 0.5 mL (has no administration in time range)  sodium chloride 0.9 % bolus 1,000 mL (0 mLs Intravenous Stopped 09/12/20 0546)     Procedures  /  Critical Care .Critical Care Performed by: Maudie Flakes, MD Authorized by: Maudie Flakes, MD   Critical care  provider statement:    Critical care time (minutes):  45   Critical care was necessary to treat or prevent imminent or life-threatening deterioration of the following conditions: Hemorrhagic shock.   Critical care was time spent personally by me on the following activities:  Discussions with consultants, evaluation of patient's response to treatment, examination of patient, ordering and performing treatments and interventions, ordering and review of laboratory studies, ordering and review of radiographic studies, pulse oximetry, re-evaluation of patient's condition, obtaining history from patient or surrogate and review of old charts Ultrasound ED Peripheral IV (Provider)  Date/Time: 09/12/2020 5:34 AM Performed by: Maudie Flakes, MD Authorized by: Maudie Flakes, MD   Procedure details:    Indications: multiple failed IV attempts     Skin Prep: chlorhexidine gluconate     Location: Right upper arm.   Angiocath:  18 G   Bedside Ultrasound Guided: Yes     Patient tolerated procedure without complications: Yes     Dressing applied: Yes   Ultrasound ED Peripheral IV (Provider)  Date/Time: 09/12/2020 5:34 AM Performed by: Maudie Flakes, MD Authorized by: Maudie Flakes, MD   Procedure details:    Indications: multiple failed IV attempts     Skin Prep: chlorhexidine gluconate     Location: Left upper arm.   Angiocath:  18 G   Bedside Ultrasound Guided: Yes     Patient tolerated procedure without complications: Yes     Dressing applied: Yes   .Marland KitchenLaceration Repair  Date/Time: 09/12/2020 7:51 AM Performed by: Maudie Flakes, MD Authorized by: Maudie Flakes, MD   Consent:    Consent obtained:  Verbal   Consent given by:  Patient   Risks, benefits, and alternatives were discussed: yes     Risks discussed:  Infection, need for additional repair, nerve damage, retained foreign body, pain, poor cosmetic result and poor wound healing Universal protocol:    Procedure explained and  questions answered to patient or proxy's satisfaction: yes     Patient identity confirmed:  Verbally with patient Anesthesia:    Anesthesia method:  Local infiltration   Local anesthetic:  Lidocaine 1% w/o epi Laceration details:    Location:  Shoulder/arm   Shoulder/arm location:  R elbow   Length (cm):  3   Depth (mm):  2 Pre-procedure details:    Preparation:  Patient was prepped and draped in usual sterile fashion Exploration:    Limited defect created (wound extended): no     Hemostasis achieved with:  Direct pressure   Wound exploration: wound explored through full range of motion and entire depth of wound visualized  Contaminated: no   Treatment:    Area cleansed with:  Saline   Amount of cleaning:  Standard Skin repair:    Repair method:  Sutures   Suture size:  3-0   Suture material:  Nylon   Suture technique:  Simple interrupted   Number of sutures:  3 Approximation:    Approximation:  Close Repair type:    Repair type:  Simple Post-procedure details:    Dressing:  Antibiotic ointment and sterile dressing   Procedure completion:  Tolerated well, no immediate complications .Marland KitchenLaceration Repair  Date/Time: 09/12/2020 7:53 AM Performed by: Maudie Flakes, MD Authorized by: Maudie Flakes, MD   Consent:    Consent obtained:  Verbal   Consent given by:  Patient   Risks, benefits, and alternatives were discussed: yes     Risks discussed:  Infection, need for additional repair, nerve damage, poor wound healing, poor cosmetic result, pain and retained foreign body Universal protocol:    Procedure explained and questions answered to patient or proxy's satisfaction: yes     Patient identity confirmed:  Verbally with patient Anesthesia:    Anesthesia method:  Local infiltration   Local anesthetic:  Lidocaine 1% w/o epi Laceration details:    Location:  Shoulder/arm   Shoulder/arm location:  L elbow   Length (cm):  2   Depth (mm):  2 Pre-procedure details:     Preparation:  Patient was prepped and draped in usual sterile fashion Exploration:    Limited defect created (wound extended): yes     Hemostasis achieved with:  Direct pressure   Wound exploration: wound explored through full range of motion and entire depth of wound visualized     Contaminated: no   Treatment:    Area cleansed with:  Saline   Amount of cleaning:  Standard Skin repair:    Repair method:  Sutures   Suture size:  3-0   Suture material:  Nylon   Suture technique:  Simple interrupted   Number of sutures:  3 Approximation:    Approximation:  Close Repair type:    Repair type:  Simple Post-procedure details:    Dressing:  Antibiotic ointment and sterile dressing   Procedure completion:  Tolerated well, no immediate complications    ED Course and Medical Decision Making  I have reviewed the triage vital signs, the nursing notes, and pertinent available records from the EMR.  Listed above are laboratory and imaging tests that I personally ordered, reviewed, and interpreted and then considered in my medical decision making (see below for details).  Suicide attempt, concern for significant blood loss.  Patient arrives tachycardic in the 140s, appears poorly perfused, altered, EMS unable to obtain blood pressure, and we are also having trouble obtaining a blood pressure.  Concern for hypovolemic shock.  Providing 2 units emergent release blood, liter of fluids.  IV access obtained as described above.  Does not appear to be any other injuries other than the lacerations to the antecubital fossa.  I do not see any signs of arterial bleeding, the wounds are hemostatic, neurovascularly intact distally.  Also suspect drug or alcohol use or potential overdose, will monitor closely.     Heart rate improving with blood and fluids, will admit to trauma for further care.  Patient evaluated by Dr. Barry Dienes of trauma surgery, she is now well-appearing, sitting up, well-perfused, heart rate  improved to the 110s.  Per documentation she has a history of sinus tachycardia at baseline.  Plan is  to recheck hemoglobin and observe medically for 1 or 2 more hours.  If hemoglobin is stable and she continues to look and feel well, she will be medically cleared and will await TTS recommendations.  Signed out to oncoming provider at shift change.  Barth Kirks. Sedonia Small, [facility] [email or web address]  Final Clinical Impressions(s) / ED Diagnoses     ICD-10-CM   1. Suicide attempt ([place])  T14.91XA   2. Laceration of upper extremity, unspecified laterality, initial encounter  S41.119A   3. Hemorrhagic shock ([place])  R57.8     ED Discharge Orders    None       Discharge Instructions Discussed with and Provided to Patient:   Discharge Instructions   None       Maudie Flakes, MD 09/12/20 0122    Maudie Flakes, MD 09/12/20 [phone]

## 2020-09-12 NOTE — ED Provider Notes (Signed)
Patient signed out to me by Dr. Sedonia Small.  Briefly 30 year old female presenting with suicide attempt, cut bilateral a/c with box cutters.  Here has improved HR down to 120 bpm (hx of sinus tachycardia with resting HR 100-110 bpm).  Pt received 2 units pRBC emergently and 1L IVF with concern for possible significant blood loss prior to arrival.   However hgb subsequently returned normal at 13.9.  Patient's BP has been stable and mildly hypertensive.  Dr Sedonia Small performed bedside laceration repair, noting relatively superficial wounds, and low suspicion for significant neurovascular injury.  Patient was agitated and upset on arrival but at the time of signout is calm, back to baseline mental status.   Patient under IVC by Dr Sedonia Small.  Plan to recheck H&H level, continue monitoring this morning.  If stable she can be medically cleared for TTS evaluation.   Clinical Course as of 09/12/20 0902  Thu Sep 12, 2020  0854 Repeat CBC with hgb stable, 14.5.  Patient medically cleared for TTS evaluation. [MT]  0856 ECG reviewed, Qtc prolonged 490's, but no STEMI.   [MT]    Clinical Course User Index [MT] Langston Masker Carola Rhine, MD   Medically cleared for TTS evaluation.  Wounds dressed appropriately.  Patient remains under IVC.  Nurse to secure pregnancy test.  Covid screen pending.   Wyvonnia Dusky, MD 09/12/20 [phone]

## 2020-09-12 NOTE — ED Notes (Signed)
Tele psych machine to bedside  

## 2020-09-12 NOTE — BH Assessment (Signed)
Festus Aloe, RN, notified through secure chat that per  Harriett Sine, NP, patient recommended for inpatient treatment.

## 2020-09-12 NOTE — ED Notes (Signed)
Please call mom Trish at [phone] with an update

## 2020-09-12 NOTE — ED Notes (Signed)
IVC paperwork completed.  

## 2020-09-12 NOTE — ED Triage Notes (Signed)
Pt comes via [facility] after SI attempted, pt took at box cutter to bilateral AC, significant blood loss per EMS, initial GCS 13, BP 90/40

## 2020-09-12 NOTE — Progress Notes (Signed)
Orthopedic Tech Progress Note Patient Details:  Quaniya Damas September 30, 1990 [id number] Level 2 Trauma  Patient ID: [name], female   DOB: July 04, 1991, 30 y.o.   MRN: [id number]   Jearld Lesch 09/12/2020, 4:03 AM

## 2020-09-12 NOTE — Telephone Encounter (Signed)
Copied from [place] [id number]. Topic: General - Call Back - No Documentation >> Sep 10, 2020 11:26 AM Erick Blinks wrote: Reason for CRM: Pt called and would like to discuss new patient appt fees as a self payer (no insurance). Pt is aware of the [place] self pay discount but has questions  Best contact: [phone]

## 2020-09-12 NOTE — ED Notes (Signed)
Pt requesting a tray to follow her Mediterranean diet, no pork, no red meat, low sodium, no sweets, and no white bread.  Kitchen notified.

## 2020-09-12 NOTE — ED Notes (Signed)
Lunch tray ordered 
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
## 2020-09-13 ENCOUNTER — Encounter (HOSPITAL_COMMUNITY): Payer: Self-pay | Admitting: Psychiatric/Mental Health

## 2020-09-13 ENCOUNTER — Other Ambulatory Visit: Payer: Self-pay

## 2020-09-13 ENCOUNTER — Inpatient Hospital Stay (HOSPITAL_COMMUNITY)
Admission: AD | Admit: 2020-09-13 | Discharge: 2020-09-24 | DRG: 885 | Disposition: A | Discharge status: Home / Self Care | Payer: Federal, State, Local not specified - Other | Attending: Emergency Medicine | Admitting: Emergency Medicine

## 2020-09-13 DIAGNOSIS — I10 Essential (primary) hypertension: Secondary | ICD-10-CM | POA: Diagnosis present

## 2020-09-13 DIAGNOSIS — F332 Major depressive disorder, recurrent severe without psychotic features: Secondary | ICD-10-CM | POA: Diagnosis present

## 2020-09-13 DIAGNOSIS — F411 Generalized anxiety disorder: Secondary | ICD-10-CM | POA: Diagnosis present

## 2020-09-13 DIAGNOSIS — Z9151 Personal history of suicidal behavior: Secondary | ICD-10-CM

## 2020-09-13 DIAGNOSIS — K760 Fatty (change of) liver, not elsewhere classified: Secondary | ICD-10-CM | POA: Diagnosis present

## 2020-09-13 DIAGNOSIS — E039 Hypothyroidism, unspecified: Secondary | ICD-10-CM | POA: Diagnosis present

## 2020-09-13 DIAGNOSIS — E119 Type 2 diabetes mellitus without complications: Secondary | ICD-10-CM | POA: Diagnosis present

## 2020-09-13 DIAGNOSIS — X788XXA Intentional self-harm by other sharp object, initial encounter: Secondary | ICD-10-CM | POA: Diagnosis present

## 2020-09-13 DIAGNOSIS — R45851 Suicidal ideations: Secondary | ICD-10-CM | POA: Diagnosis present

## 2020-09-13 DIAGNOSIS — G4700 Insomnia, unspecified: Secondary | ICD-10-CM | POA: Diagnosis present

## 2020-09-13 DIAGNOSIS — Z62810 Personal history of physical and sexual abuse in childhood: Secondary | ICD-10-CM | POA: Diagnosis present

## 2020-09-13 DIAGNOSIS — H6691 Otitis media, unspecified, right ear: Secondary | ICD-10-CM | POA: Diagnosis present

## 2020-09-13 DIAGNOSIS — Z6841 Body Mass Index (BMI) 40.0 and over, adult: Secondary | ICD-10-CM | POA: Diagnosis not present

## 2020-09-13 DIAGNOSIS — Z85820 Personal history of malignant melanoma of skin: Secondary | ICD-10-CM | POA: Diagnosis not present

## 2020-09-13 DIAGNOSIS — Z833 Family history of diabetes mellitus: Secondary | ICD-10-CM | POA: Diagnosis not present

## 2020-09-13 DIAGNOSIS — E669 Obesity, unspecified: Secondary | ICD-10-CM | POA: Diagnosis present

## 2020-09-13 DIAGNOSIS — Z79899 Other long term (current) drug therapy: Secondary | ICD-10-CM | POA: Diagnosis not present

## 2020-09-13 DIAGNOSIS — S41111A Laceration without foreign body of right upper arm, initial encounter: Secondary | ICD-10-CM | POA: Diagnosis present

## 2020-09-13 DIAGNOSIS — F4310 Post-traumatic stress disorder, unspecified: Secondary | ICD-10-CM | POA: Diagnosis present

## 2020-09-13 DIAGNOSIS — D6489 Other specified anemias: Secondary | ICD-10-CM | POA: Diagnosis present

## 2020-09-13 HISTORY — DX: Other specified postprocedural states: Z98.890

## 2020-09-13 HISTORY — DX: Tachycardia, unspecified: R00.0

## 2020-09-13 HISTORY — DX: Fatty (change of) liver, not elsewhere classified: K76.0

## 2020-09-13 HISTORY — DX: Essential (primary) hypertension: I10

## 2020-09-13 HISTORY — DX: Post-traumatic stress disorder, unspecified: F43.10

## 2020-09-13 HISTORY — DX: Generalized anxiety disorder: F41.1

## 2020-09-13 HISTORY — DX: Personal history of other medical treatment: Z92.89

## 2020-09-13 HISTORY — DX: Major depressive disorder, single episode, unspecified: F32.9

## 2020-09-13 HISTORY — DX: Type 2 diabetes mellitus without complications: E11.9

## 2020-09-13 LAB — CBC WITH DIFFERENTIAL/PLATELET
Abs Immature Granulocytes: 0.06 10*3/uL (ref 0.00–0.07)
Basophils Absolute: 0.1 10*3/uL (ref 0.0–0.1)
Basophils Relative: 1 %
Eosinophils Absolute: 0.1 10*3/uL (ref 0.0–0.5)
Eosinophils Relative: 1 %
HCT: 35.3 % — ABNORMAL LOW (ref 36.0–46.0)
Hemoglobin: 11.7 g/dL — ABNORMAL LOW (ref 12.0–15.0)
Immature Granulocytes: 1 %
Lymphocytes Relative: 19 %
Lymphs Abs: 2.2 10*3/uL (ref 0.7–4.0)
MCH: 28.7 pg (ref 26.0–34.0)
MCHC: 33.1 g/dL (ref 30.0–36.0)
MCV: 86.5 fL (ref 80.0–100.0)
Monocytes Absolute: 0.8 10*3/uL (ref 0.1–1.0)
Monocytes Relative: 7 %
Neutro Abs: 8.3 10*3/uL — ABNORMAL HIGH (ref 1.7–7.7)
Neutrophils Relative %: 71 %
Platelets: 290 10*3/uL (ref 150–400)
RBC: 4.08 MIL/uL (ref 3.87–5.11)
RDW: 14.3 % (ref 11.5–15.5)
WBC: 11.4 10*3/uL — ABNORMAL HIGH (ref 4.0–10.5)
nRBC: 0 % (ref 0.0–0.2)

## 2020-09-13 LAB — TYPE AND SCREEN
ABO/RH(D): O POS
Antibody Screen: NEGATIVE
Unit division: 0
Unit division: 0

## 2020-09-13 LAB — CBC
HCT: 34 % — ABNORMAL LOW (ref 36.0–46.0)
Hemoglobin: 11.6 g/dL — ABNORMAL LOW (ref 12.0–15.0)
MCH: 29.6 pg (ref 26.0–34.0)
MCHC: 34.1 g/dL (ref 30.0–36.0)
MCV: 86.7 fL (ref 80.0–100.0)
Platelets: 267 10*3/uL (ref 150–400)
RBC: 3.92 MIL/uL (ref 3.87–5.11)
RDW: 14.5 % (ref 11.5–15.5)
WBC: 11 10*3/uL — ABNORMAL HIGH (ref 4.0–10.5)
nRBC: 0 % (ref 0.0–0.2)

## 2020-09-13 LAB — BPAM RBC
Blood Product Expiration Date: 202202192359
Blood Product Expiration Date: 202202242359
ISSUE DATE / TIME: 202202100359
ISSUE DATE / TIME: 202202100404
Unit Type and Rh: 9500
Unit Type and Rh: 9500

## 2020-09-13 LAB — GLUCOSE, CAPILLARY: Glucose-Capillary: 166 mg/dL — ABNORMAL HIGH (ref 70–99)

## 2020-09-13 MED ORDER — LORAZEPAM 1 MG PO TABS
1.0000 mg | ORAL_TABLET | Freq: Four times a day (QID) | ORAL | Status: DC | PRN
  Administered 2020-09-13 – 2020-09-23 (×11): 1 mg via ORAL
  Filled 2020-09-13 (×14): qty 1

## 2020-09-13 MED ORDER — PANTOPRAZOLE SODIUM 40 MG PO TBEC
40.0000 mg | DELAYED_RELEASE_TABLET | Freq: Every day | ORAL | Status: DC
  Administered 2020-09-13 – 2020-09-24 (×12): 40 mg via ORAL
  Filled 2020-09-13 (×14): qty 1

## 2020-09-13 MED ORDER — DULOXETINE HCL 20 MG PO CPEP
20.0000 mg | ORAL_CAPSULE | Freq: Every day | ORAL | Status: DC
  Administered 2020-09-13 – 2020-09-15 (×3): 20 mg via ORAL
  Filled 2020-09-13 (×7): qty 1

## 2020-09-13 MED ORDER — ALUM & MAG HYDROXIDE-SIMETH 200-200-20 MG/5ML PO SUSP: 30.0000 mL | ORAL | Status: DC | PRN

## 2020-09-13 MED ORDER — HYDROXYZINE HCL 25 MG PO TABS
25.0000 mg | ORAL_TABLET | Freq: Three times a day (TID) | ORAL | Status: DC | PRN
  Administered 2020-09-15 – 2020-09-23 (×7): 25 mg via ORAL
  Filled 2020-09-13 (×4): qty 1
  Filled 2020-09-13: qty 10
  Filled 2020-09-13 (×5): qty 1

## 2020-09-13 MED ORDER — ACETAMINOPHEN 325 MG PO TABS
650.0000 mg | ORAL_TABLET | Freq: Four times a day (QID) | ORAL | Status: DC | PRN
  Administered 2020-09-20 – 2020-09-22 (×2): 650 mg via ORAL
  Filled 2020-09-13 (×2): qty 2

## 2020-09-13 MED ORDER — OLANZAPINE 10 MG IM SOLR
5.0000 mg | Freq: Four times a day (QID) | INTRAMUSCULAR | Status: DC | PRN
Start: 2020-09-13 — End: 2020-09-17

## 2020-09-13 MED ORDER — MAGNESIUM HYDROXIDE 400 MG/5ML PO SUSP
30.0000 mL | Freq: Every day | ORAL | Status: DC | PRN
  Administered 2020-09-22: 30 mL via ORAL
  Filled 2020-09-13: qty 30

## 2020-09-13 MED ORDER — LORAZEPAM 1 MG PO TABS
1.0000 mg | ORAL_TABLET | ORAL | Status: AC
  Administered 2020-09-13: 1 mg via ORAL

## 2020-09-13 MED ORDER — METOPROLOL TARTRATE 25 MG PO TABS
25.0000 mg | ORAL_TABLET | ORAL | Status: AC
  Administered 2020-09-13: 25 mg via ORAL
  Filled 2020-09-13 (×2): qty 1

## 2020-09-13 MED ORDER — METOPROLOL SUCCINATE ER 50 MG PO TB24
100.0000 mg | ORAL_TABLET | Freq: Every day | ORAL | Status: DC
  Administered 2020-09-14 – 2020-09-24 (×11): 100 mg via ORAL
  Filled 2020-09-13 (×13): qty 1

## 2020-09-13 MED ORDER — TRAZODONE HCL 50 MG PO TABS
50.0000 mg | ORAL_TABLET | Freq: Every evening | ORAL | Status: DC | PRN
  Administered 2020-09-13 – 2020-09-16 (×4): 50 mg via ORAL
  Filled 2020-09-13 (×4): qty 1

## 2020-09-13 MED ORDER — BACITRACIN-NEOMYCIN-POLYMYXIN 400-5-5000 EX OINT
TOPICAL_OINTMENT | Freq: Two times a day (BID) | CUTANEOUS | Status: DC
  Administered 2020-09-22 (×2): 1 via TOPICAL
  Filled 2020-09-13 (×4): qty 1

## 2020-09-13 NOTE — ED Notes (Signed)
PTAR called  

## 2020-09-13 NOTE — ED Provider Notes (Addendum)
[facility] EMERGENCY DEPARTMENT Provider Note   CSN: [id number] Arrival date & time: 09/13/20  1884     History Chief Complaint  Patient presents with  . Arm Injury    Mackenzie Whitaker is a 30 y.o. female.  The history is provided by the patient, the EMS personnel and the police. No language interpreter was used.  Arm Injury Location:  Elbow Elbow location:  R elbow Pt was seen here after a suicide attempt.  Pt went to [facility] this am.  Pt pulled sutures out of right  arm.  Pt reports large amount ofg blood from site      Past Medical History:  Diagnosis Date  . Diabetes mellitus ([place])   . Fatty liver disease, nonalcoholic   . GAD (generalized anxiety disorder)   . History of cardioversion   . HTN (hypertension)   . MDD (major depressive disorder)   . PTSD (post-traumatic stress disorder)   . Sinus tachycardia     Patient Active Problem List   Diagnosis Date Noted  . Severe recurrent major depression without psychotic features ([place]) 09/13/2020    Past Surgical History:  Procedure Laterality Date  . APPENDECTOMY    . SKIN CANCER EXCISION       OB History   No obstetric history on file.     History reviewed. No pertinent family history.  Social History   Tobacco Use  . Smoking status: Never Smoker  . Smokeless tobacco: Never Used  Vaping Use  . Vaping Use: Never used  Substance Use Topics  . Alcohol use: Not Currently  . Drug use: Not Currently    Home Medications Prior to Admission medications   Medication Sig Start Date End Date Taking? Authorizing Provider  COLLAGEN PO Take 1 capsule by mouth daily.    [provider]  metoprolol succinate (TOPROL-XL) 100 MG 24 hr tablet Take 100 mg by mouth in the morning. Take with or immediately following a meal.    [provider]  metoprolol tartrate (LOPRESSOR) 50 MG tablet Take 50 mg by mouth in the morning and at bedtime.    [provider]  ondansetron  (ZOFRAN-ODT) 4 MG disintegrating tablet Take 4 mg by mouth every 8 (eight) hours as needed for nausea or vomiting (dissolve orally). 09/05/20   [provider]    Allergies    Cheese, Albuterol, Amoxicillin, Avocado, Clindamycin/lincomycin, Keflex [cephalexin], Other, Wellbutrin [bupropion], and Zoloft [sertraline]  Review of Systems   Review of Systems  All other systems reviewed and are negative.   Physical Exam Updated Vital Signs BP 114/76 (BP Location: Right Arm)   Pulse (!) 149   Temp 98.3 F (36.8 C) (Oral)   Resp 17   Ht 5' 5.5" (1.664 m)   Wt 130.2 kg   SpO2 96%   BMI 47.03 kg/m   Physical Exam Vitals and nursing note reviewed.  Constitutional:      Appearance: She is well-developed and well-nourished.  HENT:     Head: Normocephalic.  Eyes:     Extraocular Movements: EOM normal.  Pulmonary:     Effort: Pulmonary effort is normal.  Abdominal:     General: There is no distension.  Musculoskeletal:        General: Normal range of motion.     Cervical back: Normal range of motion.  Skin:    Comments: 1.3 cm laceration right antecubital  Neurological:     Mental Status: She is alert and oriented to person,  place, and time.  Psychiatric:        Mood and Affect: Mood and affect and mood normal.     ED Results / Procedures / Treatments   Labs (all labs ordered are listed, but only abnormal results are displayed) Labs Reviewed  GLUCOSE, CAPILLARY - Abnormal; Notable for the following components:      Result Value   Glucose-Capillary 166 (*)    All other components within normal limits  CBC WITH DIFFERENTIAL/PLATELET - Abnormal; Notable for the following components:   WBC 11.4 (*)    Hemoglobin 11.7 (*)    HCT 35.3 (*)    Neutro Abs 8.3 (*)    All other components within normal limits    EKG None  Radiology No results found.  Procedures .Marland KitchenLaceration Repair  Date/Time: 09/13/2020 10:06 AM Performed by: Fransico Meadow, PA-C Authorized by:  Fransico Meadow, PA-C   Consent:    Consent obtained:  Verbal   Consent given by:  Patient   Risks, benefits, and alternatives were discussed: yes     Risks discussed:  Infection Universal protocol:    Procedure explained and questions answered to patient or proxy's satisfaction: yes     Relevant documents present and verified: yes     Test results available: yes     Imaging studies available: yes     Required blood products, implants, devices, and special equipment available: yes     Site/side marked: yes     Immediately prior to procedure, a time out was called: yes     Patient identity confirmed:  Verbally with patient Anesthesia:    Anesthesia method:  Local infiltration   Local anesthetic:  Lidocaine 1% WITH epi Laceration details:    Location:  Shoulder/arm   Shoulder/arm location:  R elbow   Length (cm):  1.2   Depth (mm):  2 Pre-procedure details:    Preparation:  Patient was prepped and draped in usual sterile fashion Exploration:    Limited defect created (wound extended): no     Wound exploration: wound explored through full range of motion     Contaminated: no   Treatment:    Area cleansed with:  Povidone-iodine   Visualized foreign bodies/material removed: no     Debridement:  None Skin repair:    Repair method:  Sutures   Suture size:  5-0   Suture material:  Prolene   Suture technique:  Simple interrupted   Number of sutures:  4 Approximation:    Approximation:  Close Repair type:    Repair type:  Simple Post-procedure details:    Procedure completion:  Tolerated well, no immediate complications     Medications Ordered in ED Medications  acetaminophen (TYLENOL) tablet 650 mg (has no administration in time range)  alum & mag hydroxide-simeth (MAALOX/MYLANTA) 200-200-20 MG/5ML suspension 30 mL (has no administration in time range)  magnesium hydroxide (MILK OF MAGNESIA) suspension 30 mL (has no administration in time range)  hydrOXYzine  (ATARAX/VISTARIL) tablet 25 mg (has no administration in time range)  traZODone (DESYREL) tablet 50 mg (has no administration in time range)    ED Course  I have reviewed the triage vital signs and the nursing notes.  Pertinent labs & imaging results that were available during my care of the patient were reviewed by me and considered in my medical decision making (see chart for details).    MDM Rules/Calculators/A&P  MDM:  Cbc shows hemoglobin of 11.7.  Pt observed x 2 hours.  Cbc repeated.  Hemoglobin is 11.6.  Pt advised suture removal in 7 days  Final Clinical Impression(s) / ED Diagnoses Final diagnoses:  Laceration of right upper extremity, initial encounter    Rx / DC Orders ED Discharge Orders    None    An After Visit Summary was printed and given to the patient.   Fransico Meadow, PA-C 09/13/20 1009    [address], [place] 09/13/20 1250    Pattricia Boss, MD 09/13/20 1606
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
## 2020-09-13 NOTE — Tx Team (Signed)
Initial Treatment Plan 09/12/2020 6:00 AM Mackenzie Whitaker HCW:[id number]    PATIENT STRESSORS: Financial difficulties Health problems Loss of Dad Medication change or noncompliance   PATIENT STRENGTHS: Capable of independent living Communication skills Supportive family/friends   PATIENT IDENTIFIED PROBLEMS: Suicide attempt by cutting her bilateral antecubitals with a razor blade  Not currently on any medications  Feeling "numb"  Stressor is "life"  Her Dad passed away before Christmas             DISCHARGE CRITERIA:  Improved stabilization in mood, thinking, and/or behavior Medical problems require only outpatient monitoring Need for constant or close observation no longer present Reduction of life-threatening or endangering symptoms to within safe limits Verbal commitment to aftercare and medication compliance  PRELIMINARY DISCHARGE PLAN: Attend PHP/IOP Outpatient therapy Return to previous living arrangement Return to previous work or school arrangements  PATIENT/FAMILY INVOLVEMENT: This treatment plan has been presented to and reviewed with the patient, Mackenzie Whitaker, and/or family member.  The patient and family have been given the opportunity to ask questions and make suggestions.  Mackenzie Asa, RN 09/13/2020, 6:00 AM

## 2020-09-13 NOTE — Progress Notes (Signed)
Pt returned from [facility] via EMS.  Pt alert and oriented x4.  Pt tearful on assessment with depressed mood.  Pt Denies HI, AVH and pain when assessed.  Pt endorsed SI with plan, but pt will not elaborate on what her plan is, and pt will not contract for safety at this time.  Vitals done, HR elevated at 128.Manual HR 112.  Injury sites to antecubital areas of both arms are clean, dry and intact with sutures in place.  Dressings reinforced with 4x4 gauze and kerlex and coban.  Pt offered and encouraged to drink fluids; pt declined. Pt is in involuntary status.

## 2020-09-13 NOTE — ED Notes (Signed)
Breakfast ordered 

## 2020-09-13 NOTE — ED Notes (Signed)
Lunch Tray Ordered @ 1030. 

## 2020-09-13 NOTE — Evaluation (Signed)
Called to see patient emergently this AM.  Notified at approximately 7:15 AM that the patient's roommate reported significant blood on the floor and bleeding from this new admission.  Patient is a 30 year old female who had presented to the [facility] emergency department on 09/12/2020 after a self-inflicted wound to bilateral antecubital space with significant blood loss.  On evaluation in the emergency department at that time she was mildly hypotensive with a blood pressure of 90/40.  On admission despite the blood loss her hemoglobin and hematocrit were stable.  2 sequential checks revealed hemoglobin and hematocrit to be approximately 13.6 and 40.0.  She was transfused in the emergency department.  Her hemoglobin and hematocrit on transfer were 14.5 and 42.7 respectively.  The patient was arousable, and was able to recognize her name.  No further additional history was able to be obtained except for the fact that the patient did admit that she had manipulated her wound to restart the bleeding.  On initial evaluation the patient was tachycardic to the rate of approximately 150.  Review of the EKG showed sinus tachycardia and no evidence of arrhythmia or ischemia.  Initially her blood pressure was stable, but shortly before arrival of EMS she had a systolic blood pressure of approximately 90.  Physical examination revealed that the site of the wound in the right antecubital space.  Pressure was applied and dressings were put in place.  Her pulse oximetry on room air was 100%, but 2 L O2 was added in case of significant blood loss leading to potential ischemia.  Review of the electronic medical record did not reveal the presence of any medications that could have led to excessive bleeding.  EMS arrival at approximately 7:30 AM.  Patient will be transported to the [facility] emergency department for medical evaluation and stabilization.

## 2020-09-13 NOTE — Progress Notes (Incomplete)
D: Patient unwilling to participate in assessment and refused to answer questions. Patient offered medication for anxiety and refused. Patient then reported chest pain and anxiety and requested medication. EKG performed and sinus tachycardia detected. Patient stated RN was being "mean" when asking assessment questions. Patient reports active SI with a plan but refuses to st A: R:   09/13/20 2248  Psych Admission Type (Psych Patients Only)  Admission Status Involuntary  Psychosocial Assessment  Patient Complaints Depression;Anxiety  Eye Contact Avoids  Facial Expression Flat;Sullen;Sad;Anxious  Affect Anxious;Depressed;Sullen;Sad  Speech Elective mutism;Logical/coherent;Soft;Slow  Interaction Guarded;Minimal  Appearance/Hygiene In scrubs  Behavior Characteristics Unwilling to participate  Mood Depressed;Anxious  Thought Process  Coherency WDL  Content WDL  Delusions None reported or observed  Perception WDL  Hallucination None reported or observed  Judgment Poor  Confusion None  Danger to Self  Current suicidal ideation? Active  Self-Injurious Behavior Some self-injurious ideation observed or expressed.  No lethal plan expressed   Agreement Not to Harm Self No  Description of Agreement Would not verbally contract for safety  Danger to Others  Danger to Others None reported or observed

## 2020-09-13 NOTE — ED Notes (Signed)
[employer] called for transport 

## 2020-09-13 NOTE — ED Notes (Signed)
This RN contacted [facility] to give report. Asked to call back to give report in 15 minutes due to "we are in a meeting".

## 2020-09-13 NOTE — Progress Notes (Signed)
Pt is resting and appears to be in no distress at this time.
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
## 2020-09-13 NOTE — Progress Notes (Signed)
D: Patient unwilling to participate in assessment and refused to answer questions. Patient offered medication for anxiety and refused. Patient then reported chest pain and anxiety and requested medication. EKG performed and sinus tachycardia detected. Patient stated RN was being "mean" when asking assessment questions. Patient reports active SI with a plan but refuses to state her plan. Patient denies HI. Patient denies AH/VH at this time. Patient unable to contract for safety at this time.  A: Provided positive reinforcement and encouragement. EKG performed. 1:1 monitoring maintained.  R:  Patient not cooperative and receptive to efforts. Patient remains safe on the unit under 1:1 monitoring.   09/13/20 2248  Psych Admission Type (Psych Patients Only)  Admission Status Involuntary  Psychosocial Assessment  Patient Complaints Depression;Anxiety  Eye Contact Avoids  Facial Expression Flat;Sullen;Sad;Anxious  Affect Anxious;Depressed;Sullen;Sad  Speech Elective mutism;Logical/coherent;Soft;Slow  Interaction Guarded;Minimal  Appearance/Hygiene In scrubs  Behavior Characteristics Unwilling to participate  Mood Depressed;Anxious  Thought Process  Coherency WDL  Content WDL  Delusions None reported or observed  Perception WDL  Hallucination None reported or observed  Judgment Poor  Confusion None  Danger to Self  Current suicidal ideation? Active  Self-Injurious Behavior Some self-injurious ideation observed or expressed.  No lethal plan expressed   Agreement Not to Harm Self No  Description of Agreement Would not verbally contract for safety  Danger to Others  Danger to Others None reported or observed

## 2020-09-13 NOTE — ED Triage Notes (Signed)
Patient was seen in the ED earlier and transferred to [facility] , after getting to [facility] pulled the sutures out of her left arm. Patient states she just wants to die
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
## 2020-09-13 NOTE — Medical Student Note (Signed)
[facility] DEPT Provider Student Note For educational purposes for Medical, PA and NP students only and not part of the legal medical record.   CSN: [id number] Arrival date & time: 09/13/20  0843      History   Chief Complaint Chief Complaint  Patient presents with  . Arm Injury    HPI Mackenzie Whitaker is a 30 y.o. female with recent PMH of sinus tachycardia and suicide attempt presents with EMS and PD to ED from behavioral health for removing stitches.   History obtained from patient and chart review. Per chart, patient was seen yesterday in ED for suicide attempt. She used a box cutter and lacerated both a/c's. She initially presented with AMS and tachycardia to 140s. She received 2uPRBCs by trauma and lacerations were sutures. She was discharged to behavioral health. A roommate called someone to the room because the patient was bleeding.   On exam, she has a flat affect and provides limited information. She reports she removed stitches via a comb. States that she just wants everyone to "let her go." Endorses continued SI.   Past Medical History:  Diagnosis Date  . Diabetes mellitus ([place])   . Fatty liver disease, nonalcoholic   . GAD (generalized anxiety disorder)   . History of cardioversion   . HTN (hypertension)   . MDD (major depressive disorder)   . PTSD (post-traumatic stress disorder)   . Sinus tachycardia     Patient Active Problem List   Diagnosis Date Noted  . Severe recurrent major depression without psychotic features ([place]) 09/13/2020    Past Surgical History:  Procedure Laterality Date  . APPENDECTOMY    . SKIN CANCER EXCISION      OB History   No obstetric history on file.      Home Medications    Prior to Admission medications   Medication Sig Start Date End Date Taking? Authorizing Provider  COLLAGEN PO Take 1 capsule by mouth daily.    [provider]  metoprolol succinate (TOPROL-XL) 100 MG 24 hr tablet Take 100 mg by mouth in  the morning. Take with or immediately following a meal.    [provider]  metoprolol tartrate (LOPRESSOR) 50 MG tablet Take 50 mg by mouth in the morning and at bedtime.    [provider]  ondansetron (ZOFRAN-ODT) 4 MG disintegrating tablet Take 4 mg by mouth every 8 (eight) hours as needed for nausea or vomiting (dissolve orally). 09/05/20   [provider]    Family History History reviewed. No pertinent family history.  Social History Social History   Tobacco Use  . Smoking status: Never Smoker  . Smokeless tobacco: Never Used  Vaping Use  . Vaping Use: Never used  Substance Use Topics  . Alcohol use: Not Currently  . Drug use: Not Currently     Allergies   Cheese, Albuterol, Amoxicillin, Avocado, Clindamycin/lincomycin, Keflex [cephalexin], Other, Wellbutrin [bupropion], and Zoloft [sertraline]   Review of Systems Review of Systems  Constitutional: Negative.   HENT: Negative.   Eyes: Negative.   Respiratory: Negative.   Cardiovascular: Negative.   Gastrointestinal: Negative.   Endocrine: Negative.   Genitourinary: Negative.   Musculoskeletal: Negative.   Skin: Positive for pallor and wound.  Allergic/Immunologic: Negative.   Neurological: Negative for dizziness and light-headedness.  Hematological: Negative.   Psychiatric/Behavioral: Positive for behavioral problems, self-injury and suicidal ideas.   Physical Exam Updated Vital Signs BP 114/76 (BP Location: Right Arm)   Pulse (!) 149  Temp 98.3 F (36.8 C) (Oral)   Resp 17   Ht 5\' 5"  (1.651 m)   Wt 130.2 kg   SpO2 96%   BMI 47.76 kg/m   Physical Exam Vitals and nursing note reviewed.  Constitutional:      General: She is not in acute distress. HENT:     Head: Normocephalic.     Nose: Nose normal.  Eyes:     Pupils: Pupils are equal, round, and reactive to light.  Cardiovascular:     Rate and Rhythm: Regular rhythm. Tachycardia present.     Pulses: Normal pulses.   Pulmonary:     Effort: Pulmonary effort is normal.  Musculoskeletal:     Cervical back: Normal range of motion.  Skin:    General: Skin is warm and dry.     Capillary Refill: Capillary refill takes less than 2 seconds.     Coloration: Skin is pale.     Findings: Lesion present.       Neurological:     General: No focal deficit present.     Mental Status: She is alert.  Psychiatric:        Mood and Affect: Mood is depressed. Affect is flat.        Speech: Speech is delayed.        Behavior: Behavior is slowed and withdrawn.        Thought Content: Thought content includes suicidal ideation. Thought content includes suicidal plan.        Judgment: Judgment is inappropriate.      ED Treatments / Results  Labs (all labs ordered are listed, but only abnormal results are displayed) Labs Reviewed  GLUCOSE, CAPILLARY - Abnormal; Notable for the following components:      Result Value   Glucose-Capillary 166 (*)    All other components within normal limits  CBC WITH DIFFERENTIAL/PLATELET - Abnormal; Notable for the following components:   WBC 11.4 (*)    Hemoglobin 11.7 (*)    HCT 35.3 (*)    Neutro Abs 8.3 (*)    All other components within normal limits  CBC - Abnormal; Notable for the following components:   WBC 11.0 (*)    Hemoglobin 11.6 (*)    HCT 34.0 (*)    All other components within normal limits    EKG  Radiology No results found.  Procedures Procedures (including critical care time)  Medications Ordered in ED Medications  acetaminophen (TYLENOL) tablet 650 mg (has no administration in time range)  alum & mag hydroxide-simeth (MAALOX/MYLANTA) 200-200-20 MG/5ML suspension 30 mL (has no administration in time range)  magnesium hydroxide (MILK OF MAGNESIA) suspension 30 mL (has no administration in time range)  hydrOXYzine (ATARAX/VISTARIL) tablet 25 mg (has no administration in time range)  traZODone (DESYREL) tablet 50 mg (has no administration in time  range)     Initial Impression / Assessment and Plan / ED Course  I have reviewed the triage vital signs and the nursing notes.  Pertinent labs & imaging results that were available during my care of the patient were reviewed by me and considered in my medical decision making (see chart for details).  Mackenzie Whitaker is a 33yoF with recent suicide attempt discharged this morning from ED to behavioral health for lacerations to bilateral a/c. She subsequently opened her sutures on the right AC with a comb.   Hgb 11.3 --> 11.4 on repeat. Stable.  Laceration repaired with sutures and wrapped.   Discharge back to behavioral  health with increased 1:1 monitoring.   Final Clinical Impressions(s) / ED Diagnoses   Final diagnoses:  Laceration of right upper extremity, initial encounter    New Prescriptions New Prescriptions   No medications on file

## 2020-09-13 NOTE — Progress Notes (Addendum)
1:1 MHT called this nurse for immediate assistance. Pt was extremely upset and vigorously taking off her dressings that were on both of pt's arms. Pt did not disturb her sutures on either arm.  After pt had removed the dressings in entirety, RN talked to pt.  Pt stated that she felt the dressings were "too tight and causing me to hurt".  RN encouraged pt to let staff know what pt is feeling first before acting impulsively.  Pt did not say either way if she would try to let staff know pt's feelings before acting impulsively.    MD ordered additional 1mg  Ativan now.  Pt initially declined to take  the additional Ativan but after much education and encouragement, pt took the ativan.  1:1 sitter remains in place for pt's safety.

## 2020-09-13 NOTE — Discharge Instructions (Addendum)
Return for redness drainage or if you develop a fever.  Please try not to put your hands into the wound this could increase the risk of infection.

## 2020-09-13 NOTE — Tx Team (Signed)
Interdisciplinary Treatment and Diagnostic Plan Update  09/13/2020 Time of Session: 9:20am  Mackenzie Whitaker MRN: [id number]  Principal Diagnosis: <principal problem not specified>  Secondary Diagnoses: Active Problems:   Severe recurrent major depression without psychotic features ([place])   Current Medications:  Current Facility-Administered Medications  Medication Dose Route Frequency Provider Last Rate Last Admin  . acetaminophen (TYLENOL) tablet 650 mg  650 mg Oral Q6H PRN Rozetta Nunnery, NP      . alum & mag hydroxide-simeth (MAALOX/MYLANTA) 200-200-20 MG/5ML suspension 30 mL  30 mL Oral Q4H PRN Lindon Romp A, NP      . hydrOXYzine (ATARAX/VISTARIL) tablet 25 mg  25 mg Oral TID PRN Lindon Romp A, NP      . magnesium hydroxide (MILK OF MAGNESIA) suspension 30 mL  30 mL Oral Daily PRN Lindon Romp A, NP      . traZODone (DESYREL) tablet 50 mg  50 mg Oral QHS PRN Rozetta Nunnery, NP       Current Outpatient Medications  Medication Sig Dispense Refill  . COLLAGEN PO Take 1 capsule by mouth daily.    . metoprolol succinate (TOPROL-XL) 100 MG 24 hr tablet Take 100 mg by mouth in the morning. Take with or immediately following a meal.    . metoprolol tartrate (LOPRESSOR) 50 MG tablet Take 50 mg by mouth in the morning and at bedtime.    . ondansetron (ZOFRAN-ODT) 4 MG disintegrating tablet Take 4 mg by mouth every 8 (eight) hours as needed for nausea or vomiting (dissolve orally).     PTA Medications: (Not in a hospital admission)   Patient Stressors: Financial difficulties Health problems Loss of Dad Medication change or noncompliance  Patient Strengths: Capable of independent living Communication skills Supportive family/friends  Treatment Modalities: Medication Management, Group therapy, Case management,  1 to 1 session with clinician, Psychoeducation, Recreational therapy.   Physician Treatment Plan for Primary Diagnosis: <principal problem not specified> Long Term Goal(s):      Short Term Goals:    Medication Management: Evaluate patient's response, side effects, and tolerance of medication regimen.  Therapeutic Interventions: 1 to 1 sessions, Unit Group sessions and Medication administration.  Evaluation of Outcomes: Not Met  Physician Treatment Plan for Secondary Diagnosis: Active Problems:   Severe recurrent major depression without psychotic features ([place])  Long Term Goal(s):     Short Term Goals:       Medication Management: Evaluate patient's response, side effects, and tolerance of medication regimen.  Therapeutic Interventions: 1 to 1 sessions, Unit Group sessions and Medication administration.  Evaluation of Outcomes: Not Met   RN Treatment Plan for Primary Diagnosis: <principal problem not specified> Long Term Goal(s): Knowledge of disease and therapeutic regimen to maintain health will improve  Short Term Goals: Ability to remain free from injury will improve, Ability to demonstrate self-control, Ability to participate in decision making will improve, Ability to verbalize feelings will improve, Ability to disclose and discuss suicidal ideas and Ability to identify and develop effective coping behaviors will improve  Medication Management: RN will administer medications as ordered by provider, will assess and evaluate patient's response and provide education to patient for prescribed medication. RN will report any adverse and/or side effects to prescribing provider.  Therapeutic Interventions: 1 on 1 counseling sessions, Psychoeducation, Medication administration, Evaluate responses to treatment, Monitor vital signs and CBGs as ordered, Perform/monitor CIWA, COWS, AIMS and Fall Risk screenings as ordered, Perform wound care treatments as ordered.  Evaluation of Outcomes: Not Met  LCSW Treatment Plan for Primary Diagnosis: <principal problem not specified> Long Term Goal(s): Safe transition to appropriate next level of care at discharge,  Engage patient in therapeutic group addressing interpersonal concerns.  Short Term Goals: Engage patient in aftercare planning with referrals and resources, Increase ability to appropriately verbalize feelings, Increase emotional regulation, Facilitate acceptance of mental health diagnosis and concerns, Facilitate patient progression through stages of change regarding substance use diagnoses and concerns and Increase skills for wellness and recovery  Therapeutic Interventions: Assess for all discharge needs, 1 to 1 time with Social worker, Explore available resources and support systems, Assess for adequacy in community support network, Educate family and significant other(s) on suicide prevention, Complete Psychosocial Assessment, Interpersonal group therapy.  Evaluation of Outcomes: Not Met   Progress in Treatment: Attending groups: No. Participating in groups: No. Taking medication as prescribed: Yes. Toleration medication: Yes. Family/Significant other contact made: No, will contact:  If consents are provided  Patient understands diagnosis: No. Discussing patient identified problems/goals with staff: Yes. Medical problems stabilized or resolved: Yes. Denies suicidal/homicidal ideation: Yes. Issues/concerns per patient self-inventory: No.   New problem(s) identified: No, Describe:  None   New Short Term/Long Term Goal(s): medication stabilization, elimination of SI thoughts, development of comprehensive mental wellness plan.   Patient Goals:  Pt did not attend  Discharge Plan or Barriers:  Patient recently admitted. CSW will continue to follow and assess for appropriate referrals and possible discharge planning.   Reason for Continuation of Hospitalization: Anxiety Depression Medication stabilization Suicidal ideation  Estimated Length of Stay: 3 to 5 days   Attendees: Patient: Mackenzie Whitaker  09/13/2020   Physician: Lala Lund, MD 09/13/2020   Nursing:  09/13/2020   RN  Care Manager: 09/13/2020   Social Worker: Mackenzie Whitaker, Mackenzie Whitaker  09/13/2020   Recreational Therapist:  09/13/2020   Other:  09/13/2020   Other:  09/13/2020   Other: 09/13/2020     Scribe for Treatment Team: Darleen Crocker, [place] 09/13/2020 2:06 PM
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
## 2020-09-13 NOTE — Progress Notes (Signed)
Adult Psychoeducational Group Note  Date:  09/13/2020 Time:  9:21 PM  Group Topic/Focus:  Wrap-Up Group:   The focus of this group is to help patients review their daily goal of treatment and discuss progress on daily workbooks.  Participation Level:  Did Not Attend  Participation Quality:  Did Not Attend  Affect:  Did Not Attend  Cognitive:  Did Not Attend  Insight: None  Engagement in Group:  Did Not Attend  Modes of Intervention:  Did Not Attend  Additional Comments:  Pt did not attend evening wrap up group tonight.  [name] 09/13/2020, 9:21 PM

## 2020-09-13 NOTE — Progress Notes (Signed)
This patient is accepted [facility] room 304-2 by Harriett Sine. Attending is Dr Mallie Darting. Please call report to [phone]

## 2020-09-14 ENCOUNTER — Other Ambulatory Visit: Payer: Self-pay

## 2020-09-14 ENCOUNTER — Emergency Department (HOSPITAL_COMMUNITY): Admission: EM | Admit: 2020-09-14 | Discharge: 2020-09-14 | Discharge status: Absent without leave | Payer: Self-pay

## 2020-09-14 NOTE — Progress Notes (Signed)
Notified by [facility] nursing staff that the patient ripped her stitches out of her right antecubital fossa area tonight.  Notified by [facility] nursing staff that this is the second time that the patient has ripped stitches out of this area recently. [facility] nursing requesting that the patient have her right antecubital fossa area evaluated/have new stitches put in if medically necessary.  Will transfer the patient to [facility] emergency department to have her right antecubital fossa area evaluated/potentially have new stitches put in if deemed medically necessary by an EDP.  Provider report given to Dr. Johnney Killian via phone and Dr. Johnney Killian has agreed to accept the patient.  Notified Dr. Johnney Killian that the patient can be transferred back to [facility] once she is medically cleared in the ED.  Dr. Johnney Killian verbalizes understanding and agreement of this plan. [facility] nursing staff to call report to [facility] ED charge nurse.  EMTALA form completed.
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
## 2020-09-14 NOTE — Psychosocial Assessment (Signed)
Clinical Social Work Note  CSW did [facility] and fax referral to [facility] no longer requires an authorization.    Selmer Dominion, LCSW 09/14/2020, 10:17 AM

## 2020-09-14 NOTE — Progress Notes (Signed)
Remains on 1:1 monitoring for safety with MHT. Currently asleep and has been for past two hours. Attended group for approx 15 minutes and left out went to her room, MHT at her side. Went in behind her and she was sitting on bed crying. Asked how she was feeling, what were her thoughts at this moment and she said  she wants to try ECT. I informed her I would relay this information to her doctor. Contracted for safety. Reassurance and positive encouragement was given at that time. Remains on 1:1 for safety.

## 2020-09-14 NOTE — Progress Notes (Signed)
Secure chat sent to MD to make him aware that patient had refused dressing this morning to bilateral antecubital lacerations.

## 2020-09-14 NOTE — ED Provider Notes (Signed)
[facility] DEPT Provider Note   CSN: [id number] Arrival date & time: 09/13/20  3154     History Chief Complaint  Patient presents with  . Arm Injury    Mackenzie Whitaker is a 30 y.o. female.  30 yo F recently seen for suicide attempt by laceration of the right AC.  Patient had this sutured and had been medically cleared and sent to behavioral health.  The patient had intentionally removed her stitches at the facility.  There was some concern and so she was sent back here for evaluation.  The history is provided by the patient.  Arm Injury Associated symptoms: no fever   Illness Severity:  Moderate Onset quality:  Gradual Duration:  1 day Timing:  Constant Progression:  Unchanged Chronicity:  New Associated symptoms: no chest pain, no congestion, no fever, no headaches, no myalgias, no nausea, no rhinorrhea, no shortness of breath, no vomiting and no wheezing        Past Medical History:  Diagnosis Date  . Diabetes mellitus ([place])   . Fatty liver disease, nonalcoholic   . GAD (generalized anxiety disorder)   . History of cardioversion   . HTN (hypertension)   . MDD (major depressive disorder)   . PTSD (post-traumatic stress disorder)   . Sinus tachycardia     Patient Active Problem List   Diagnosis Date Noted  . Severe recurrent major depression without psychotic features ([place]) 09/13/2020  . Recurrent major depression-severe ([place]) 09/13/2020    Past Surgical History:  Procedure Laterality Date  . APPENDECTOMY    . SKIN CANCER EXCISION       OB History   No obstetric history on file.     History reviewed. No pertinent family history.  Social History   Tobacco Use  . Smoking status: Never Smoker  . Smokeless tobacco: Never Used  Vaping Use  . Vaping Use: Never used  Substance Use Topics  . Alcohol use: Not Currently  . Drug use: Not Currently    Home Medications Prior to Admission medications   Medication Sig Start  Date End Date Taking? Authorizing Provider  COLLAGEN PO Take 1 capsule by mouth daily.    [provider]  metoprolol succinate (TOPROL-XL) 100 MG 24 hr tablet Take 100 mg by mouth in the morning. Take with or immediately following a meal.    [provider]  metoprolol tartrate (LOPRESSOR) 50 MG tablet Take 50 mg by mouth in the morning and at bedtime.    [provider]  ondansetron (ZOFRAN-ODT) 4 MG disintegrating tablet Take 4 mg by mouth every 8 (eight) hours as needed for nausea or vomiting (dissolve orally). 09/05/20   [provider]    Allergies    Cheese, Albuterol, Amoxicillin, Avocado, Clindamycin/lincomycin, Keflex [cephalexin], Other, Wellbutrin [bupropion], and Zoloft [sertraline]  Review of Systems   Review of Systems  Constitutional: Negative for chills and fever.  HENT: Negative for congestion and rhinorrhea.   Eyes: Negative for redness and visual disturbance.  Respiratory: Negative for shortness of breath and wheezing.   Cardiovascular: Negative for chest pain and palpitations.  Gastrointestinal: Negative for nausea and vomiting.  Genitourinary: Negative for dysuria and urgency.  Musculoskeletal: Negative for arthralgias and myalgias.  Skin: Positive for wound. Negative for pallor.  Neurological: Negative for dizziness and headaches.    Physical Exam Updated Vital Signs BP 116/86 (BP Location: Left Arm)   Pulse (!) 125   Temp 98.6 F (37 C) (Oral)   Resp  15   Ht 5' 5.5" (1.664 m)   Wt 130.2 kg   SpO2 98%   BMI 47.03 kg/m   Physical Exam Vitals and nursing note reviewed.  Constitutional:      General: She is not in acute distress.    Appearance: She is well-developed and well-nourished. She is not diaphoretic.  HENT:     Head: Normocephalic and atraumatic.  Eyes:     Extraocular Movements: EOM normal.     Pupils: Pupils are equal, round, and reactive to light.  Cardiovascular:     Rate and Rhythm: Normal rate and  regular rhythm.     Heart sounds: No murmur heard. No friction rub. No gallop.   Pulmonary:     Effort: Pulmonary effort is normal.     Breath sounds: No wheezing or rales.  Abdominal:     General: There is no distension.     Palpations: Abdomen is soft.     Tenderness: There is no abdominal tenderness.  Musculoskeletal:        General: No tenderness or edema.     Cervical back: Normal range of motion and neck supple.     Comments: Laceration to the right AC.  No bleeding.  No erythema no drainage.  No fluctuance.  No foreign bodies.  Skin:    General: Skin is warm and dry.  Neurological:     Mental Status: She is alert and oriented to person, place, and time.  Psychiatric:        Mood and Affect: Mood and affect normal.        Behavior: Behavior normal.     ED Results / Procedures / Treatments   Labs (all labs ordered are listed, but only abnormal results are displayed) Labs Reviewed  GLUCOSE, CAPILLARY - Abnormal; Notable for the following components:      Result Value   Glucose-Capillary 166 (*)    All other components within normal limits  CBC WITH DIFFERENTIAL/PLATELET - Abnormal; Notable for the following components:   WBC 11.4 (*)    Hemoglobin 11.7 (*)    HCT 35.3 (*)    Neutro Abs 8.3 (*)    All other components within normal limits  CBC - Abnormal; Notable for the following components:   WBC 11.0 (*)    Hemoglobin 11.6 (*)    HCT 34.0 (*)    All other components within normal limits  TSH  LIPID PANEL  HEMOGLOBIN A1C  HEMOGLOBIN AND HEMATOCRIT, BLOOD    EKG EKG Interpretation  Date/Time:  Friday September 13 2020 08:48:24 EST Ventricular Rate:  135 PR Interval:    QRS Duration: 81 QT Interval:  301 QTC Calculation: 452 R Axis:   61 Text Interpretation: Sinus tachycardia Borderline T wave abnormalities Confirmed by Addison Lank [phone]) on 09/14/2020 2:36:32 PM   Radiology No results found.  Procedures Procedures   Medications Ordered in  ED Medications  acetaminophen (TYLENOL) tablet 650 mg (has no administration in time range)  alum & mag hydroxide-simeth (MAALOX/MYLANTA) 200-200-20 MG/5ML suspension 30 mL (has no administration in time range)  magnesium hydroxide (MILK OF MAGNESIA) suspension 30 mL (has no administration in time range)  hydrOXYzine (ATARAX/VISTARIL) tablet 25 mg (has no administration in time range)  traZODone (DESYREL) tablet 50 mg (50 mg Oral Given 09/14/20 2011)  neomycin-bacitracin-polymyxin (NEOSPORIN) ointment packet ( Topical Given 09/14/20 1746)  DULoxetine (CYMBALTA) DR capsule 20 mg (20 mg Oral Given 09/14/20 0855)  OLANZapine (ZYPREXA) injection 5 mg (has no administration  in time range)  LORazepam (ATIVAN) tablet 1 mg (1 mg Oral Given 09/14/20 2011)  pantoprazole (PROTONIX) EC tablet 40 mg (40 mg Oral Given 09/14/20 0855)  metoprolol succinate (TOPROL-XL) 24 hr tablet 100 mg (100 mg Oral Given 09/14/20 0855)  metoprolol tartrate (LOPRESSOR) tablet 25 mg (25 mg Oral Given 09/13/20 1536)  LORazepam (ATIVAN) tablet 1 mg (1 mg Oral Given 09/13/20 1622)    ED Course  I have reviewed the triage vital signs and the nursing notes.  Pertinent labs & imaging results that were available during my care of the patient were reviewed by me and considered in my medical decision making (see chart for details).    MDM Rules/Calculators/A&P                          30 yo F with a chief complaint of wound dehiscence.  Patient is at behavioral health for suicidality and had a sutured self-inflicted wound to the right AC.  She had remove the stitches and was sent here for evaluation.  No obvious signs of infection.  I discussed local wound care with the patient.  We'll have her follow-up with her family doctor in the office.  11:34 PM:  I have discussed the diagnosis/risks/treatment options with the patient and believe the pt to be eligible for discharge home to follow-up with PCP. We also discussed returning to the ED  immediately if new or worsening sx occur. We discussed the sx which are most concerning (e.g., sudden worsening pain, fever, inability to tolerate by mouth) that necessitate immediate return. Medications administered to the patient during their visit and any new prescriptions provided to the patient are listed below.  Medications given during this visit Medications  acetaminophen (TYLENOL) tablet 650 mg (has no administration in time range)  alum & mag hydroxide-simeth (MAALOX/MYLANTA) 200-200-20 MG/5ML suspension 30 mL (has no administration in time range)  magnesium hydroxide (MILK OF MAGNESIA) suspension 30 mL (has no administration in time range)  hydrOXYzine (ATARAX/VISTARIL) tablet 25 mg (has no administration in time range)  traZODone (DESYREL) tablet 50 mg (50 mg Oral Given 09/14/20 2011)  neomycin-bacitracin-polymyxin (NEOSPORIN) ointment packet ( Topical Given 09/14/20 1746)  DULoxetine (CYMBALTA) DR capsule 20 mg (20 mg Oral Given 09/14/20 0855)  OLANZapine (ZYPREXA) injection 5 mg (has no administration in time range)  LORazepam (ATIVAN) tablet 1 mg (1 mg Oral Given 09/14/20 2011)  pantoprazole (PROTONIX) EC tablet 40 mg (40 mg Oral Given 09/14/20 0855)  metoprolol succinate (TOPROL-XL) 24 hr tablet 100 mg (100 mg Oral Given 09/14/20 0855)  metoprolol tartrate (LOPRESSOR) tablet 25 mg (25 mg Oral Given 09/13/20 1536)  LORazepam (ATIVAN) tablet 1 mg (1 mg Oral Given 09/13/20 1622)     The patient appears reasonably screen and/or stabilized for discharge and I doubt any other medical condition or other [facility] requiring further screening, evaluation, or treatment in the ED at this time prior to discharge.   Final Clinical Impression(s) / ED Diagnoses Final diagnoses:  Laceration of right upper extremity, initial encounter    Rx / DC Orders ED Discharge Orders    None       Deno Etienne, DO 09/14/20 2334

## 2020-09-14 NOTE — [facility] Group Notes (Signed)
  [facility] LCSW Group Therapy Note  Date/Time:  09/14/2020 11:15AM-12:00PM  Type of Therapy and Topic:  Group Therapy:  Feelings About Hospitalization  Participation Level:  Did Not Attend   Description of Group This process group involved patients discussing their feelings related to being hospitalized, as well as the benefits they see to being in the hospital.  These feelings and benefits were itemized.  The group then brainstormed specific ways in which they could seek those same benefits when they discharge and return home.  Therapeutic Goals 1. Patient will identify and describe positive and negative feelings related to hospitalization 2. Patient will verbalize benefits of hospitalization to themselves personally 3. Patients will brainstorm together ways they can obtain similar benefits in the outpatient setting, identify barriers to wellness and possible solutions   Therapeutic Modalities Cognitive Behavioral Therapy Motivational Interviewing    Selmer Dominion, LCSW 09/14/2020, 2:20 PM

## 2020-09-14 NOTE — Progress Notes (Signed)
Remains on 1:1 with MHT for safety. Ate 100% of her dinner and fluids. Continues to endorse active SI with plan, will not report plan. Has been more verbal as shift progressed and receptive. Continued 1:1 with sitter for safety.

## 2020-09-14 NOTE — Progress Notes (Signed)
Pt agreed to let me apply Neosporin ointment to bilateral antecubital lacerations. No noted redness or drainage, sutures intact. Non-adherent dressing applied and secured with Kerlix, patient assisted by holding dressing while I applied tape.

## 2020-09-14 NOTE — Progress Notes (Signed)
1:1 remains, sitter in place for patient's safety. Has been in bed most of morning, did get up twice to make a phone call. She did participate minimally in assessment this morning and answered some questions before shutting down. Verbalizes that she does hear voices and they only say two things to her and refused to share what they say to her. Refuses to answer if they command her or not. Positive reinforcement and encouragement given. Verbally contracts for safety. Remains safe at this time, 1:1 monitoring continues.

## 2020-09-14 NOTE — [facility] Group Notes (Signed)
.  Psychoeducational Group Note  Date: 09-14-2020 Time: 0900-1000    Goal Setting   Purpose of Group: This group helps to provide patients with the steps of setting a goal that is specific, measurable, attainable, realistic and time specific. A discussion on how we keep ourselves stuck with negative self talk.    Participation Level:  Limited interaction  Participation Quality:  Volume of her voice was soft but she did answer some of the questions  Affect:  Flat   Cognitive:  Answers were appropriate  Insight:  unknown  Engagement in Group:  listening  Additional Comments:  Pt stated that she" did not want to be here". When asked what goal she would like to set today she said sleep. Encouraged Pt to attempt a different type of goal, like bathing. Became up and left the room. After the group, this writer, went to the room to speak with Pt. She said that she would be able to write out 25 positives about herself and that she was a good Curator. Praised. Paper and crayons were left with Pt so tht she could use them.  Paulino Rily

## 2020-09-15 LAB — HEMOGLOBIN AND HEMATOCRIT, BLOOD
HCT: 26.2 % — ABNORMAL LOW (ref 36.0–46.0)
Hemoglobin: 8.7 g/dL — ABNORMAL LOW (ref 12.0–15.0)

## 2020-09-15 LAB — HEMOGLOBIN A1C
Hgb A1c MFr Bld: 5.1 % (ref 4.8–5.6)
Mean Plasma Glucose: 99.67 mg/dL

## 2020-09-15 LAB — LIPID PANEL
Cholesterol: 68 mg/dL (ref 0–200)
HDL: 27 mg/dL — ABNORMAL LOW (ref >40)
LDL Cholesterol: 33 mg/dL (ref 0–99)
Total CHOL/HDL Ratio: 2.5 RATIO
Triglycerides: 40 mg/dL (ref <150)
VLDL: 8 mg/dL (ref 0–40)

## 2020-09-15 MED ORDER — MELATONIN 5 MG PO TABS
5.0000 mg | ORAL_TABLET | Freq: Every evening | ORAL | Status: DC | PRN
  Administered 2020-09-15: 5 mg via ORAL
  Filled 2020-09-15: qty 1

## 2020-09-15 MED ORDER — DULOXETINE HCL 30 MG PO CPEP
30.0000 mg | ORAL_CAPSULE | Freq: Every day | ORAL | Status: DC
  Administered 2020-09-16: 30 mg via ORAL
  Filled 2020-09-15 (×3): qty 1

## 2020-09-15 NOTE — Progress Notes (Signed)
1:1 Nursing Note    Patient lying in bed resting with eyes closed with MHT sitter at bedside for safety. No distress noted

## 2020-09-15 NOTE — [facility] Group Notes (Signed)
[facility] LCSW Group Therapy Note  Date/Time:  09/15/2020  11:00AM-12:00PM  Type of Therapy and Topic:  Group Therapy:  Music and Mood  Participation Level:  Did Not Attend     Selmer Dominion, LCSW
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
## 2020-09-15 NOTE — Progress Notes (Signed)
Patient received melatonin to help her rest. She is currently lying in bed supine awake with sitter at bedside, 1:1 continues for safety

## 2020-09-15 NOTE — Progress Notes (Signed)
MHT reported that the pt was in her room upset. Writer went to talk to the pt and the pt was observed to be lying down in bed crying. She reported having flashbacks and stated that she didn't want to talk about it because she doesn't want to keep thinking about it. She expressed having suicidal ideations but would not elaborate. She was offered Ativan and agreed to taking the ativan. Patient's nurse, Mateo Flow, RN., made aware.

## 2020-09-15 NOTE — [facility] Group Notes (Signed)
Adult Psychoeducational Group Not Date:  09/15/2020 Time:  0900-1045 Group Topic/Focus: PROGRESSIVE RELAXATION. A group where deep breathing is taught and tensing and relaxation muscle groups is used. Imagery is used as well.  Pts are asked to imagine 3 pillars that hold them up when they are not able to hold themselves up.  Participation Level:  Did not attend  Paulino Rily

## 2020-09-15 NOTE — ED Notes (Signed)
Sheriff present to take pt back to [facility]

## 2020-09-15 NOTE — Progress Notes (Signed)
[facility] MD Progress Note  09/15/2020 11:14 AM Tammi Boulier  MRN:  [id number] Subjective:  Patient is a 30 year old female with a past psychiatric history significant for depression, posttraumatic stress disorder, suspected borderline personality disorder who was admitted on 09/13/2020 after a self-inflicted injury for suicide attempt.  Objective: Patient is seen and examined. Patient is a 30 year old female with the above-stated past psychiatric history who is seen in follow-up. She is essentially unchanged. Unfortunately last night she again pulled out her stitches and had to be sent to the emergency department. The decision was made in the emergency department to not put any sutures in given her continued behavioral problems and the fact that it was thought to perhaps cause more problems than it was worth. This morning she is relatively sedated. She had return from the emergency department at approximately 1:30 AM. She stated that her suicidal ideation remains the same. That was about all I could get out of her from her sedation. Her vital signs are stable, she is afebrile. Total amount of sleep last night was 4.75 hours. We had ordered an H&H for this morning, but apparently she either refused it or the blood draw in folks did not come this AM. We will reorder that for tomorrow to make sure that her numbers are improving. Her insight regarding the hospitalization is near absent. Her last blood sugar that she allowed [place] to take was on 2/11 and was 166. Hemoglobin A1c at this point has not been able to be obtained.  Principal Problem: <principal problem not specified> Diagnosis: Active Problems:   Severe recurrent major depression without psychotic features (HCC)   Recurrent major depression-severe (HCC)  Total Time spent with patient: 20 minutes  Past Psychiatric History: See admission H&P  Past Medical History:  Past Medical History:  Diagnosis Date  . Diabetes mellitus ([place])   . Fatty liver  disease, nonalcoholic   . GAD (generalized anxiety disorder)   . History of cardioversion   . HTN (hypertension)   . MDD (major depressive disorder)   . PTSD (post-traumatic stress disorder)   . Sinus tachycardia     Past Surgical History:  Procedure Laterality Date  . APPENDECTOMY    . SKIN CANCER EXCISION     Family History: History reviewed. No pertinent family history. Family Psychiatric  History: See admission H&P Social History:  Social History   Substance and Sexual Activity  Alcohol Use Not Currently     Social History   Substance and Sexual Activity  Drug Use Not Currently    Social History   Socioeconomic History  . Marital status: Single    Spouse name: Not on file  . Number of children: Not on file  . Years of education: Not on file  . Highest education level: Not on file  Occupational History  . Not on file  Tobacco Use  . Smoking status: Never Smoker  . Smokeless tobacco: Never Used  Vaping Use  . Vaping Use: Never used  Substance and Sexual Activity  . Alcohol use: Not Currently  . Drug use: Not Currently  . Sexual activity: Not Currently  Other Topics Concern  . Not on file  Social History Narrative  . Not on file   Social Determinants of Health   Financial Resource Strain: Not on file  Food Insecurity: Not on file  Transportation Needs: Not on file  Physical Activity: Not on file  Stress: Not on file  Social Connections: Not on file   Additional Social  History:                         Sleep: Fair  Appetite:  Fair  Current Medications: Current Facility-Administered Medications  Medication Dose Route Frequency Provider Last Rate Last Admin  . acetaminophen (TYLENOL) tablet 650 mg  650 mg Oral Q6H PRN Lindon Romp A, NP      . alum & mag hydroxide-simeth (MAALOX/MYLANTA) 200-200-20 MG/5ML suspension 30 mL  30 mL Oral Q4H PRN Rozetta Nunnery, NP      . DULoxetine (CYMBALTA) DR capsule 20 mg  20 mg Oral Daily Sharma Covert, MD   20 mg at 09/15/20 2094  . hydrOXYzine (ATARAX/VISTARIL) tablet 25 mg  25 mg Oral TID PRN Rozetta Nunnery, NP      . LORazepam (ATIVAN) tablet 1 mg  1 mg Oral Q6H PRN Sharma Covert, MD   1 mg at 09/14/20 2011  . magnesium hydroxide (MILK OF MAGNESIA) suspension 30 mL  30 mL Oral Daily PRN Lindon Romp A, NP      . melatonin tablet 5 mg  5 mg Oral QHS PRN Prescilla Sours, PA-C   5 mg at 09/15/20 0148  . metoprolol succinate (TOPROL-XL) 24 hr tablet 100 mg  100 mg Oral Daily Sharma Covert, MD   100 mg at 09/15/20 0905  . neomycin-bacitracin-polymyxin (NEOSPORIN) ointment packet   Topical BID Sharma Covert, MD   Given at 09/14/20 1746  . OLANZapine (ZYPREXA) injection 5 mg  5 mg Intramuscular Q6H PRN Sharma Covert, MD      . pantoprazole (PROTONIX) EC tablet 40 mg  40 mg Oral Daily Sharma Covert, MD   40 mg at 09/15/20 0855  . traZODone (DESYREL) tablet 50 mg  50 mg Oral QHS PRN Rozetta Nunnery, NP   50 mg at 09/14/20 2011    Lab Results:  Results for orders placed or performed during the hospital encounter of 09/13/20 (from the past 48 hour(s))  CBC     Status: Abnormal   Collection Time: 09/13/20 11:26 AM  Result Value Ref Range   WBC 11.0 (H) 4.0 - 10.5 K/uL   RBC 3.92 3.87 - 5.11 MIL/uL   Hemoglobin 11.6 (L) 12.0 - 15.0 g/dL   HCT 34.0 (L) 36.0 - 46.0 %   MCV 86.7 80.0 - 100.0 fL   MCH 29.6 26.0 - 34.0 pg   MCHC 34.1 30.0 - 36.0 g/dL   RDW 14.5 11.5 - 15.5 %   Platelets 267 150 - 400 K/uL   nRBC 0.0 0.0 - 0.2 %    Comment: Performed at [facility], [place] [address] [place], [place] [address]    Blood Alcohol level:  Lab Results  Component Value Date   ETH <10 83/66/2947    Metabolic Disorder Labs: No results found for: HGBA1C, MPG No results found for: PROLACTIN No results found for: CHOL, TRIG, HDL, CHOLHDL, VLDL, LDLCALC  Physical Findings: AIMS: Facial and Oral Movements Muscles of Facial Expression: None, normal Lips and  Perioral Area: None, normal Jaw: None, normal Tongue: None, normal,Extremity Movements Upper (arms, wrists, hands, fingers): None, normal Lower (legs, knees, ankles, toes): None, normal, Trunk Movements Neck, shoulders, hips: None, normal, Overall Severity Severity of abnormal movements (highest score from questions above): None, normal Incapacitation due to abnormal movements: None, normal Patient's awareness of abnormal movements (rate only patient's report): No Awareness, Dental Status Current problems with teeth  and/or dentures?: No Does patient usually wear dentures?: No  CIWA:  CIWA-Ar Total: 2 COWS:     Musculoskeletal: Strength & Muscle Tone: within normal limits Gait & Station: normal Patient leans: N/A  Psychiatric Specialty Exam: Physical Exam Vitals and nursing note reviewed.  Constitutional:      Appearance: She is obese.  HENT:     Head: Normocephalic and atraumatic.  Pulmonary:     Effort: Pulmonary effort is normal.  Neurological:     General: No focal deficit present.     Mental Status: She is alert.     Review of Systems  Blood pressure 116/74, pulse (!) 111, temperature 97.9 F (36.6 C), resp. rate 18, height 5' 5.5" (1.664 m), weight 130.2 kg, SpO2 98 %.Body mass index is 47.03 kg/m.  General Appearance: Disheveled  Eye Contact:  Minimal  Speech:  Slow  Volume:  Decreased  Mood:  Depressed and Dysphoric  Affect:  Congruent  Thought Process:  Coherent and Descriptions of Associations: Circumstantial  Orientation:  Negative  Thought Content:  Rumination  Suicidal Thoughts:  Yes.  with intent/plan  Homicidal Thoughts:  No  Memory:  Immediate;   Poor Recent;   Poor Remote;   Poor  Judgement:  Impaired  Insight:  Lacking  Psychomotor Activity:  Decreased  Concentration:  Concentration: Poor and Attention Span: Poor  Recall:  Poor  Fund of Knowledge:  Poor  Language:  Fair  Akathisia:  Negative  Handed:  Right  AIMS (if indicated):      Assets:  Desire for Improvement Resilience  ADL's:  Impaired  Cognition:  WNL  Sleep:  Number of Hours: 4.75     Treatment Plan Summary: Daily contact with patient to assess and evaluate symptoms and progress in treatment, Medication management and Plan : Patient is seen and examined. Patient is a 30 year old female with the above-stated past psychiatric history who is seen in follow-up.   Diagnosis: 1.  Major depression, recurrent, severe without psychotic features 2.  Posttraumatic stress disorder. 3.  Probable borderline personality disorder. 4.  Unspecified tachycardia (with EKGs here showing a sinus tachycardia). 5.  History of hypertension. 6.  History of hepatic steatosis with mildly elevated liver function enzymes. 7. Probable diabetes  Pertinent findings on examination today: 1. Patient remains actively suicidal and acting out. She remains on one-to-one for safety. The decision was made to leave her stitches out given her continued manipulation of the wound. 2. Sedated this a.m. after having gone to the emergency department late last evening. 3. Given her history and continued attempts at self-harm we have sent a referral to [facility], and I have discussed with social work to continue to send updates. 4. Patient has continued to refuse Accu-Cheks.  Plan: 1. Increase Cymbalta to 30 mg p.o. daily starting 09/16/2020. This is for depression and anxiety. 2. Reorder hemoglobin A1c for this p.m. 3. Continue Toprol-XL 100 mg p.o. daily for hypertension and tachycardia. 4. Continue Neosporin ointment to the self-inflicted injury wound. 5. Continue Zyprexa Zydis 5 mg IM every 6 hours as needed agitation. 6. Continue Protonix 40 mg p.o. daily for gastric protection. 7. Melatonin was added last night and this will be continued for sleep. 8. Continue trazodone 50 mg p.o. nightly as needed insomnia. 9. Disposition planning-we will continue to send progress notes and  other additional information to [facility]. I have asked social work and attempt to get her records from [place] where she at least reports she was  hospitalized there for 6 months.   Sharma Covert, MD 09/15/2020, 11:14 AM

## 2020-09-15 NOTE — Progress Notes (Signed)
MHT reported that the pt was in her room upset. Writer went to talk to the pt and the pt was observed to be lying down in bed crying. She reported having flashbacks and stated that she didn't want to talk about it because she doesn't want to keep thinking about it. She was offered Ativan and agreed to taking the ativan.

## 2020-09-15 NOTE — Progress Notes (Signed)
Writer asked patient before leaving to go to the ED if she would like for her mother notified and she replied "no". When asked if wanting writer to contact her friend she declined also.

## 2020-09-15 NOTE — ED Notes (Signed)
Pt came in from [facility] with c/o R AC lac due to removal of stitches. Pt did this while sitter present in room. Bleeding controlled. Lac is from prior suicide attempt that had been repaired with stitches.

## 2020-09-15 NOTE — Progress Notes (Signed)
1:1 Nursing Note  Patient resting in bed with eyes closed-appears to be sleeping- respirations even and unlabored. 1:1 sitter at bedside for safety.

## 2020-09-15 NOTE — Progress Notes (Signed)
Patient lying in bed awake reporting difficulty falling asleep. She requested melatonin to help her rest.

## 2020-09-15 NOTE — Progress Notes (Signed)
[place] NOVEL CORONAVIRUS (COVID-19) DAILY CHECK-OFF SYMPTOMS - answer yes or no to each - every day NO YES  Have you had a fever in the past 24 hours?  . Fever (Temp > 37.80C / 100F) X   Have you had any of these symptoms in the past 24 hours? . New Cough .  Sore Throat  .  Shortness of Breath .  Difficulty Breathing .  Unexplained Body Aches   X   Have you had any one of these symptoms in the past 24 hours not related to allergies?   . Runny Nose .  Nasal Congestion .  Sneezing   X   If you have had runny nose, nasal congestion, sneezing in the past 24 hours, has it worsened?  X   EXPOSURES - check yes or no X   Have you traveled outside the state in the past 14 days?  X   Have you been in contact with someone with a confirmed diagnosis of COVID-19 or PUI in the past 14 days without wearing appropriate PPE?  X   Have you been living in the same home as a person with confirmed diagnosis of COVID-19 or a PUI (household contact)?    X   Have you been diagnosed with COVID-19?    X              What to do next: Answered NO to all: Answered YES to anything:   Proceed with unit schedule Follow the [facility] Inpatient Flowsheet.   

## 2020-09-15 NOTE — Progress Notes (Signed)
1:1 Nursing note- Patient is currently lying bed supine asleep. No distress noted, patient is snoring. 1:1 continues with sitter at bedside for safety.

## 2020-09-15 NOTE — Progress Notes (Addendum)
D. Pt presents with a flat affect, depressed mood- in bed resting with eyes closed upon initial approach- pt reports feeling tired because of poor sleep last night. 1:1 sitter at bedside who accompanied pt to bathroom.  A. Labs and vitals monitored. Pt given and educated on medications. A personal pitcher of water was given to pt and she was encouraged to drink. Pt supported emotionally and encouraged to express concerns and ask questions.  R. Pt remains safe with 15 minute checks and 1:1 monitoring . Will continue POC.

## 2020-09-15 NOTE — Progress Notes (Signed)
1:1 late entry- Writer entered patients room after being informed that patient had removed sutures from her arm. Patient reported that she was very sneaky while removing the sutures so that her sitter did not see her. AC and NP on call notified and order given to send patient to ED for evaluation of the site.

## 2020-09-15 NOTE — Progress Notes (Signed)
   09/14/20 2100  Psych Admission Type (Psych Patients Only)  Admission Status Involuntary  Psychosocial Assessment  Patient Complaints Worthlessness;Isolation;Depression  Eye Contact Avoids  Facial Expression Flat;Sullen;Sad;Anxious  Affect Anxious;Depressed;Sullen;Sad  Speech Logical/coherent;Soft;Slow  Interaction Guarded;Minimal  Motor Activity Slow  Appearance/Hygiene In scrubs  Behavior Characteristics Guarded;Calm  Mood Depressed;Sad;Preoccupied  Thought Process  Coherency WDL  Content WDL  Delusions None reported or observed  Perception WDL  Hallucination Auditory  Judgment Poor  Confusion None  Danger to Self  Current suicidal ideation? Active  Self-Injurious Behavior Some self-injurious ideation observed or expressed.  No lethal plan expressed   Agreement Not to Harm Self No  Description of Agreement verbally contract for safety  Danger to Others  Danger to Others None reported or observed

## 2020-09-15 NOTE — Progress Notes (Signed)
Patient's mother Mackenzie Whitaker was notified of her daughter's suicide attempt. Her response when told was "okay". AC [name] informed that call was made.

## 2020-09-15 NOTE — Progress Notes (Signed)
   09/15/20 0500  Sleep  Number of Hours 4.75

## 2020-09-16 LAB — LIPID PANEL
Cholesterol: 64 mg/dL (ref 0–200)
HDL: 27 mg/dL — ABNORMAL LOW (ref >40)
LDL Cholesterol: 29 mg/dL (ref 0–99)
Total CHOL/HDL Ratio: 2.4 RATIO
Triglycerides: 41 mg/dL (ref <150)
VLDL: 8 mg/dL (ref 0–40)

## 2020-09-16 LAB — HEMOGLOBIN A1C
Hgb A1c MFr Bld: 5 % (ref 4.8–5.6)
Mean Plasma Glucose: 96.8 mg/dL

## 2020-09-16 LAB — TSH: TSH: 3.622 u[IU]/mL (ref 0.350–4.500)

## 2020-09-16 LAB — HEMOGLOBIN AND HEMATOCRIT, BLOOD
HCT: 27.9 % — ABNORMAL LOW (ref 36.0–46.0)
Hemoglobin: 8.9 g/dL — ABNORMAL LOW (ref 12.0–15.0)

## 2020-09-16 LAB — T4, FREE: Free T4: 1.17 ng/dL — ABNORMAL HIGH (ref 0.61–1.12)

## 2020-09-16 MED ORDER — DULOXETINE HCL 20 MG PO CPEP
40.0000 mg | ORAL_CAPSULE | Freq: Every day | ORAL | Status: DC
  Filled 2020-09-16 (×2): qty 2

## 2020-09-16 MED ORDER — FERROUS SULFATE 325 (65 FE) MG PO TABS
325.0000 mg | ORAL_TABLET | Freq: Two times a day (BID) | ORAL | Status: DC
  Administered 2020-09-16 – 2020-09-23 (×14): 325 mg via ORAL
  Filled 2020-09-16 (×16): qty 1

## 2020-09-16 MED ORDER — SODIUM CHLORIDE 0.9 % IN NEBU
INHALATION_SOLUTION | RESPIRATORY_TRACT | Status: AC
  Filled 2020-09-16: qty 3

## 2020-09-16 MED ORDER — MELATONIN 3 MG PO TABS
3.0000 mg | ORAL_TABLET | Freq: Every evening | ORAL | Status: DC | PRN
  Administered 2020-09-18 – 2020-09-23 (×6): 3 mg via ORAL
  Filled 2020-09-16 (×7): qty 1

## 2020-09-16 MED ORDER — WHITE PETROLATUM EX OINT
TOPICAL_OINTMENT | CUTANEOUS | Status: AC
  Filled 2020-09-16: qty 5

## 2020-09-16 MED ORDER — DULOXETINE HCL 30 MG PO CPEP
30.0000 mg | ORAL_CAPSULE | Freq: Every day | ORAL | Status: AC
  Administered 2020-09-16: 30 mg via ORAL
  Filled 2020-09-16: qty 1

## 2020-09-16 NOTE — Progress Notes (Signed)
Cleaned pt wounds in right and left AC area with saline solution. Stitches in place. No evidence of redness, heat or drainage. Area dried and Neosporin ointment applied bilaterally. Ointment applied to bruised areas on left arm as well. Will continue to monitor.

## 2020-09-16 NOTE — Progress Notes (Signed)
Psychoeducational Group Note  Date:  09/16/2020 Time:  0050  Group Topic/Focus:  Wrap-Up Group:   The focus of this group is to help patients review their daily goal of treatment and discuss progress on daily workbooks.  Participation Level: Did Not Attend  Participation Quality:  Not Applicable  Affect:  Not Applicable  Cognitive:  Not Applicable  Insight:  Not Applicable  Engagement in Group: Not Applicable  Additional Comments:  The patient did not attend group this evening.   Archie Balboa S 09/16/2020, 12:50 AM

## 2020-09-16 NOTE — Progress Notes (Addendum)
Pt currently up and sitting in dayroom, watching a movie.  Pt appears to be in no acute distress and she thanked Therapist, sports for changing pt's dressing in right AC area.  Neosporin also applied to area on R and Left AC.

## 2020-09-16 NOTE — [facility] Group Notes (Addendum)
[facility] LCSW Group Therapy  09/16/2020 1:18 PM  Type of Therapy:  Group Therapy: Self-Compassion   Participation Level:  Minimal  Summary of Progress/Problems: CSW met with this patient individually due to limited amount of patients interested in attending group. Patient did not share or participate in discussion.    Bernedette Auston A Yates Weisgerber 09/16/2020, 1:18 PM

## 2020-09-16 NOTE — Progress Notes (Incomplete)
[facility] MD Progress Note  Mackenzie Whitaker  MRN:  [id number]   Chief Complaint:   Subjective:  Patient is a 30 year old female with a past psychiatric history significant for depression, posttraumatic stress disorder, suspected borderline personality disorder who was admitted on 09/13/2020 after a self-inflicted injury for suicide attempt.The patient is currently on Hospital Day 3.   Chart Review from last 24 hours:  The patient's chart was reviewed and nursing notes were reviewed. The patient's case was discussed in multidisciplinary team meeting. Per [facility]   Information Obtained Today During Patient Interview: The patient was seen and evaluated on the unit. On assessment today the patient reports ***  Principal Problem: <principal problem not specified> Diagnosis: Active Problems:   Severe recurrent major depression without psychotic features ([place])   Recurrent major depression-severe ([place])  Total Time Spent in Direct Patient Care:  I personally spent *** minutes on the unit in direct patient care. The direct patient care time included face-to-face time with the patient, reviewing the patient's chart, communicating with other professionals, and coordinating care. Greater than 50% of this time was spent in counseling or coordinating care with the patient regarding goals of hospitalization, psycho-education, and discharge planning needs.  Past Psychiatric History: see admission H&P  Past Medical History:  Past Medical History:  Diagnosis Date  . Diabetes mellitus ([place])   . Fatty liver disease, nonalcoholic   . GAD (generalized anxiety disorder)   . History of cardioversion   . HTN (hypertension)   . MDD (major depressive disorder)   . PTSD (post-traumatic stress disorder)   . Sinus tachycardia     Past Surgical History:  Procedure Laterality Date  . APPENDECTOMY    . SKIN CANCER EXCISION     Family History: see admission H&P  Family Psychiatric  History: see admissin H&P  Social  History:  Social History   Substance and Sexual Activity  Alcohol Use Not Currently     Social History   Substance and Sexual Activity  Drug Use Not Currently    Social History   Socioeconomic History  . Marital status: Single    Spouse name: Not on file  . Number of children: Not on file  . Years of education: Not on file  . Highest education level: Not on file  Occupational History  . Not on file  Tobacco Use  . Smoking status: Never Smoker  . Smokeless tobacco: Never Used  Vaping Use  . Vaping Use: Never used  Substance and Sexual Activity  . Alcohol use: Not Currently  . Drug use: Not Currently  . Sexual activity: Not Currently  Other Topics Concern  . Not on file  Social History Narrative  . Not on file   Social Determinants of Health   Financial Resource Strain: Not on file  Food Insecurity: Not on file  Transportation Needs: Not on file  Physical Activity: Not on file  Stress: Not on file  Social Connections: Not on file   Sleep: {BHH GOOD/FAIR/POOR:22877}  Appetite:  {BHH GOOD/FAIR/POOR:22877}  Current Medications: Current Facility-Administered Medications  Medication Dose Route Frequency Provider Last Rate Last Admin  . acetaminophen (TYLENOL) tablet 650 mg  650 mg Oral Q6H PRN Lindon Romp A, NP      . alum & mag hydroxide-simeth (MAALOX/MYLANTA) 200-200-20 MG/5ML suspension 30 mL  30 mL Oral Q4H PRN Rozetta Nunnery, NP      . DULoxetine (CYMBALTA) DR capsule 30 mg  30 mg Oral QHS Sharma Covert, MD      . [  START ON 09/17/2020] DULoxetine (CYMBALTA) DR capsule 40 mg  40 mg Oral QHS Sharma Covert, MD      . ferrous sulfate tablet 325 mg  325 mg Oral BID WC Sharma Covert, MD      . hydrOXYzine (ATARAX/VISTARIL) tablet 25 mg  25 mg Oral TID PRN Rozetta Nunnery, NP   25 mg at 09/15/20 2205  . LORazepam (ATIVAN) tablet 1 mg  1 mg Oral Q6H PRN Sharma Covert, MD   1 mg at 09/15/20 1408  . magnesium hydroxide (MILK OF MAGNESIA) suspension 30  mL  30 mL Oral Daily PRN Lindon Romp A, NP      . melatonin tablet 3 mg  3 mg Oral QHS PRN Sharma Covert, MD      . metoprolol succinate (TOPROL-XL) 24 hr tablet 100 mg  100 mg Oral Daily Sharma Covert, MD   100 mg at 09/16/20 1696  . neomycin-bacitracin-polymyxin (NEOSPORIN) ointment packet   Topical BID Sharma Covert, MD   Given at 09/16/20 1249  . OLANZapine (ZYPREXA) injection 5 mg  5 mg Intramuscular Q6H PRN Sharma Covert, MD      . pantoprazole (PROTONIX) EC tablet 40 mg  40 mg Oral Daily Sharma Covert, MD   40 mg at 09/16/20 0810  . traZODone (DESYREL) tablet 50 mg  50 mg Oral QHS PRN Rozetta Nunnery, NP   50 mg at 09/15/20 2205    Lab Results:  Results for orders placed or performed during the hospital encounter of 09/13/20 (from the past 48 hour(s))  Lipid panel     Status: Abnormal   Collection Time: 09/14/20 11:27 PM  Result Value Ref Range   Cholesterol 68 0 - 200 mg/dL   Triglycerides 40 <150 mg/dL   HDL 27 (L) >40 mg/dL   Total CHOL/HDL Ratio 2.5 RATIO   VLDL 8 0 - 40 mg/dL   LDL Cholesterol 33 0 - 99 mg/dL    Comment:        Total Cholesterol/HDL:CHD Risk Coronary Heart Disease Risk Table                     Men   Women  1/2 Average Risk   3.4   3.3  Average Risk       5.0   4.4  2 X Average Risk   9.6   7.1  3 X Average Risk  23.4   11.0        Use the calculated Patient Ratio above and the CHD Risk Table to determine the patient's CHD Risk.        ATP III CLASSIFICATION (LDL):  <100     mg/dL   Optimal  100-129  mg/dL   Near or Above                    Optimal  130-159  mg/dL   Borderline  160-189  mg/dL   High  >190     mg/dL   Very High Performed at [facility] [address], [place], [place] [address]   Hemoglobin A1c     Status: None   Collection Time: 09/14/20 11:27 PM  Result Value Ref Range   Hgb A1c MFr Bld 5.1 4.8 - 5.6 %    Comment: (NOTE) Pre diabetes:          5.7%-6.4%  Diabetes:               >  6.4%  Glycemic control for   <7.0% adults with diabetes    Mean Plasma Glucose 99.67 mg/dL    Comment: Performed at [facility] [address] [place], [place] [address]  Hemoglobin and hematocrit, blood     Status: Abnormal   Collection Time: 09/14/20 11:27 PM  Result Value Ref Range   Hemoglobin 8.7 (L) 12.0 - 15.0 g/dL   HCT 26.2 (L) 36.0 - 46.0 %    Comment: Performed at [facility], [place] [address], [place], [place] [address]  Hemoglobin A1c     Status: None   Collection Time: 09/16/20  6:37 AM  Result Value Ref Range   Hgb A1c MFr Bld 5.0 4.8 - 5.6 %    Comment: (NOTE) Pre diabetes:          5.7%-6.4%  Diabetes:              >6.4%  Glycemic control for   <7.0% adults with diabetes    Mean Plasma Glucose 96.8 mg/dL    Comment: Performed at [facility] [address], [place], [place] [address]  Lipid panel     Status: Abnormal   Collection Time: 09/16/20  6:37 AM  Result Value Ref Range   Cholesterol 64 0 - 200 mg/dL   Triglycerides 41 <150 mg/dL   HDL 27 (L) >40 mg/dL   Total CHOL/HDL Ratio 2.4 RATIO   VLDL 8 0 - 40 mg/dL   LDL Cholesterol 29 0 - 99 mg/dL    Comment:        Total Cholesterol/HDL:CHD Risk Coronary Heart Disease Risk Table                     Men   Women  1/2 Average Risk   3.4   3.3  Average Risk       5.0   4.4  2 X Average Risk   9.6   7.1  3 X Average Risk  23.4   11.0        Use the calculated Patient Ratio above and the CHD Risk Table to determine the patient's CHD Risk.        ATP III CLASSIFICATION (LDL):  <100     mg/dL   Optimal  100-129  mg/dL   Near or Above                    Optimal  130-159  mg/dL   Borderline  160-189  mg/dL   High  >190     mg/dL   Very High Performed at [facility] [address] [place], [place] [address]   TSH     Status: None   Collection Time: 09/16/20  6:37 AM  Result Value Ref Range   TSH 3.622 0.350 - 4.500 uIU/mL    Comment: Performed  by a 3rd Generation assay with a functional sensitivity of <=0.01 uIU/mL. Performed at [facility], [place] [address] [place], [place] [address]   Hemoglobin and hematocrit, blood     Status: Abnormal   Collection Time: 09/16/20  6:37 AM  Result Value Ref Range   Hemoglobin 8.9 (L) 12.0 - 15.0 g/dL   HCT 27.9 (L) 36.0 - 46.0 %    Comment: Performed at [facility], [place] [address] [place], [place] [address]   Blood Alcohol level:  Lab Results  Component Value Date   ETH <10 09/12/2020  Metabolic Disorder Labs: Lab Results  Component Value Date   HGBA1C 5.0 09/16/2020   MPG 96.8 09/16/2020   MPG 99.67 09/14/2020   No results found for: PROLACTIN Lab Results  Component Value Date   CHOL 64 09/16/2020   TRIG 41 09/16/2020   HDL 27 (L) 09/16/2020   CHOLHDL 2.4 09/16/2020   VLDL 8 09/16/2020   LDLCALC 29 09/16/2020   LDLCALC 33 09/14/2020    Physical Findings: AIMS: Facial and Oral Movements Muscles of Facial Expression: None, normal Lips and Perioral Area: None, normal Jaw: None, normal Tongue: None, normal,Extremity Movements Upper (arms, wrists, hands, fingers): None, normal Lower (legs, knees, ankles, toes): None, normal, Trunk Movements Neck, shoulders, hips: None, normal, Overall Severity Severity of abnormal movements (highest score from questions above): None, normal Incapacitation due to abnormal movements: None, normal Patient's awareness of abnormal movements (rate only patient's report): No Awareness, Dental Status Current problems with teeth and/or dentures?: No Does patient usually wear dentures?: No  CIWA:  CIWA-Ar Total: 3     Musculoskeletal: Strength & Muscle Tone: within normal limits Gait & Station: normal Patient leans: N/A  Psychiatric Specialty Exam: Physical Exam  Review of Systems  Blood pressure 119/69, pulse 99, temperature 97.9 F (36.6 C), resp. rate 18, height 5' 5.5" (1.664 m), weight 130.2 kg,  SpO2 98 %.Body mass index is 47.03 kg/m.  General Appearance: {Appearance:22683}  Eye Contact:  {BHH EYE CONTACT:22684}  Speech:  {Speech:22685}  Volume:  {Volume (PAA):22686}  Mood:  {BHH MOOD:22306}  Affect:  {Affect (PAA):22687}  Thought Process:  {Thought Process (PAA):22688}  Orientation:  {BHH ORIENTATION (PAA):22689}  Thought Content:  {Thought Content:22690}  Suicidal Thoughts:  {ST/HT (PAA):22692}  Homicidal Thoughts:  {ST/HT (PAA):22692}  Memory:  {BHH MEMORY:22881}  Judgement:  {Judgement (PAA):22694}  Insight:  {Insight (PAA):22695}  Psychomotor Activity:  {Psychomotor (PAA):22696}  Concentration:  {Concentration:21399}  Recall:  {BHH GOOD/FAIR/POOR:22877}  Fund of Knowledge:  {BHH GOOD/FAIR/POOR:22877}  Language:  {BHH GOOD/FAIR/POOR:22877}  Akathisia:  {BHH YES OR NO:22294}  Handed:  {Handed:22697}  AIMS (if indicated):     Assets:  {Assets (PAA):22698}  ADL's:  {BHH CBJ'S:28315}  Cognition:  {chl bhh cognition:304700322}  Sleep:  Number of Hours: 5   Treatment Plan Summary: ASSESSMENT: Diagnoses / Active Problems: MDD recurrent severe without psychotic features PTSD Cluster B traits  History of idiopathic sinus tachycardia s/p cardioversion in the past History of hypertension Mildly elevated liver function enzymes with h/o fatty liver Anemia s/p self-inflicted wounds Elevated non-fasting glucose   PLAN: 1. Safety and Monitoring:  -- Involuntary admission to inpatient psychiatric unit for safety, stabilization and treatment  -- Daily contact with patient to assess and evaluate symptoms and progress in treatment  -- Patient's case to be discussed in multi-disciplinary team meeting  -- Observation Level : q15 minute checks  -- Vital signs:  q12 hours  -- Precautions: suicide  2. Psychiatric Diagnoses and Treatment:   MDD recurrent severe without psychotic features  PTSD  Cluster B traits  -- Scheduled to increase Cymbalta to 40mg  nightly starting  tonight  -- Continue Trazodone 50mg  qhs PRN insomnia and Melatonin 3mg  po qhs PRN insomnia  -- Would benefit from CBT and DBT after discharge  -- Encouraged patient to participate in unit milieu and in scheduled group therapies   -- Short Term Goals: Ability to identify changes in lifestyle to reduce recurrence of condition will improve and Ability to disclose and discuss suicidal ideas  -- Long Term Goals: Improvement in symptoms  so as ready for discharge    3. Medical Issues Being Addressed:   HTN  -- Continue Toprol XL 100mg  daily   H/o Idiopathic Sinus Tachycardia s/p previous cardioversion  -- Continue Toprol XL 100mg  daily  -- HR today   GERD  -- Continue Protonix 40mg  daily for gastric protection   Self-inflicted wound  -- TD booster confirmed  -- Continue Neosporin topically bid   T3 and T4 pending with TSH 3.622   Anemia (H/H 8.9/27.9)  -- Continue Iron 325mg  po bid  -- s/p 2 unit transfusion in ED for blood loss from self-inflicted wound prior to admission  -- Start Colace 100mg  daily prophylactically for constipation while on Iron   Elevated admission Glucose 208  -- HbgA1c 5.0   Mildly Elevated LFTS  -- AST 66 and ALT 88 with report of h/o fatty liver  -- Will recheck LFTS for trending as well as Acute hepatitis panel  -- Monitor closely while on Cymbalta  4. Discharge Planning:   -- Social work and case management to assist with discharge planning and identification of hospital follow-up needs prior to discharge  -- Estimated LOS: TBD  -- Discharge Concerns: Need to establish a safety plan; Medication compliance and effectiveness  -- Discharge Goals: Return home with outpatient referrals for mental health follow-up including medication management/psychotherapy  Harlow Asa, MD, FAPA 09/16/2020, 4:25 PM

## 2020-09-16 NOTE — [facility] Counselor (Signed)
Per [facility] this patients referral is currently under medical review at their facility.    Darletta Moll MSW, LCSW Clincal Social Worker  [facility]

## 2020-09-16 NOTE — Progress Notes (Signed)
Pt in bed, coloring and in no acute distress.  1:1 MHT next to pt.  Pt appears to doing well and denies needs at this time.

## 2020-09-16 NOTE — Progress Notes (Signed)
Pt appears to be snoring and is lying in bed at this time.  Pt's respirations are even and unlabored and pt is in no acute distress. 1:1 MHT at pt's bedside.

## 2020-09-16 NOTE — Progress Notes (Signed)
1:1 Note Pt is observed laying in bed  A sleep with eyes closed. Respiration is even and unlabored. No behavioral issues noted at this time. Staff by the bedside for safety, pt remains safe, will continue to monitor.

## 2020-09-16 NOTE — Progress Notes (Signed)
Recreation Therapy Notes  Date: 2.14.22 Time: 1000 Location: 500 Hall Dayroom  Group Topic: Stress Management  Goal Area(s) Addresses:  Patient will identify stressful situations. Patient will identify coping skills to deal with stressful situations.  Intervention: Worksheet   Activity: Stress Exploration.  Patients were to identify daily hassles, major life changes and life circumstances and rate them from least stressful to most stressful. Patients would then identify coping skills they use for daily uplifts, healthy coping strategies and protective factors. Ratings for stressors can be found in parenthesis.   Education:  Stress Management, Discharge Planning.   Education Outcome: Acknowledges Education  Clinical Observations/Feedback: Pt did not attend group.    Victorino Sparrow, LRT/CTRS         Victorino Sparrow A 09/16/2020 12:01 PM

## 2020-09-16 NOTE — Progress Notes (Signed)
1:1 Note Pt is resting in bed with eyes closed. Pt took all her scheduled medications before bed. The wound assessed and noted to clean and dry without drainage. Pt calm and cooperative, remains on 1:1 for safety, will continue to monitor.

## 2020-09-16 NOTE — Progress Notes (Signed)
[facility] MD Progress Note  09/16/2020 1:18 PM Mackenzie Whitaker  MRN:  [id number] Subjective:  Patient is a 30 year old female with a past psychiatric history significant for depression, posttraumatic stress disorder, suspected borderline personality disorder who was admitted on 09/13/2020 after a self-inflicted injury for suicide attempt.  Objective: Patient is seen and examined.  Patient is a 30 year old female with the above-stated past psychiatric history who is seen in follow-up.  She may actually be slightly better today.  She stated her suicidal ideation was present, but less so.  She did state she was able to speak with her mother yesterday and that conversation went well.  Review of the nursing notes from yesterday.  There were no behavioral issues noted.  It was documented that she was calm and cooperative.  Her vital signs are stable, she is afebrile.  She slept 5 hours last night.  She did state that the current medications cause her to be sleepy and that was the only side effect that she noted.  Her lipid panel from 2/14 showed completely normal lipids.  A hemoglobin and hematocrit were obtained.  On 2/12 her hemoglobin was 8.7 and hematocrit was 26.2.  Today hemoglobin was 8.9 and hematocrit was 27.9.  Her hemoglobin A1c from 2/14 was actually very good at 5.0.  TSH was mildly abnormal with a TSH of 3.622.  Principal Problem: <principal problem not specified> Diagnosis: Active Problems:   Severe recurrent major depression without psychotic features (HCC)   Recurrent major depression-severe (HCC)  Total Time spent with patient: 20 minutes  Past Psychiatric History: See admission H&P  Past Medical History:  Past Medical History:  Diagnosis Date  . Diabetes mellitus ([place])   . Fatty liver disease, nonalcoholic   . GAD (generalized anxiety disorder)   . History of cardioversion   . HTN (hypertension)   . MDD (major depressive disorder)   . PTSD (post-traumatic stress disorder)   . Sinus  tachycardia     Past Surgical History:  Procedure Laterality Date  . APPENDECTOMY    . SKIN CANCER EXCISION     Family History: History reviewed. No pertinent family history. Family Psychiatric  History: See admission H&P Social History:  Social History   Substance and Sexual Activity  Alcohol Use Not Currently     Social History   Substance and Sexual Activity  Drug Use Not Currently    Social History   Socioeconomic History  . Marital status: Single    Spouse name: Not on file  . Number of children: Not on file  . Years of education: Not on file  . Highest education level: Not on file  Occupational History  . Not on file  Tobacco Use  . Smoking status: Never Smoker  . Smokeless tobacco: Never Used  Vaping Use  . Vaping Use: Never used  Substance and Sexual Activity  . Alcohol use: Not Currently  . Drug use: Not Currently  . Sexual activity: Not Currently  Other Topics Concern  . Not on file  Social History Narrative  . Not on file   Social Determinants of Health   Financial Resource Strain: Not on file  Food Insecurity: Not on file  Transportation Needs: Not on file  Physical Activity: Not on file  Stress: Not on file  Social Connections: Not on file   Additional Social History:                         Sleep: Fair  Appetite:  Good  Current Medications: Current Facility-Administered Medications  Medication Dose Route Frequency Provider Last Rate Last Admin  . acetaminophen (TYLENOL) tablet 650 mg  650 mg Oral Q6H PRN Lindon Romp A, NP      . alum & mag hydroxide-simeth (MAALOX/MYLANTA) 200-200-20 MG/5ML suspension 30 mL  30 mL Oral Q4H PRN Rozetta Nunnery, NP      . DULoxetine (CYMBALTA) DR capsule 30 mg  30 mg Oral Daily Sharma Covert, MD   30 mg at 09/16/20 4967  . hydrOXYzine (ATARAX/VISTARIL) tablet 25 mg  25 mg Oral TID PRN Rozetta Nunnery, NP   25 mg at 09/15/20 2205  . LORazepam (ATIVAN) tablet 1 mg  1 mg Oral Q6H PRN Sharma Covert, MD   1 mg at 09/15/20 1408  . magnesium hydroxide (MILK OF MAGNESIA) suspension 30 mL  30 mL Oral Daily PRN Lindon Romp A, NP      . melatonin tablet 5 mg  5 mg Oral QHS PRN Prescilla Sours, PA-C   5 mg at 09/15/20 0148  . metoprolol succinate (TOPROL-XL) 24 hr tablet 100 mg  100 mg Oral Daily Sharma Covert, MD   100 mg at 09/16/20 5916  . neomycin-bacitracin-polymyxin (NEOSPORIN) ointment packet   Topical BID Sharma Covert, MD   Given at 09/16/20 1249  . OLANZapine (ZYPREXA) injection 5 mg  5 mg Intramuscular Q6H PRN Sharma Covert, MD      . pantoprazole (PROTONIX) EC tablet 40 mg  40 mg Oral Daily Sharma Covert, MD   40 mg at 09/16/20 0810  . traZODone (DESYREL) tablet 50 mg  50 mg Oral QHS PRN Rozetta Nunnery, NP   50 mg at 09/15/20 2205    Lab Results:  Results for orders placed or performed during the hospital encounter of 09/13/20 (from the past 48 hour(s))  Lipid panel     Status: Abnormal   Collection Time: 09/14/20 11:27 PM  Result Value Ref Range   Cholesterol 68 0 - 200 mg/dL   Triglycerides 40 <150 mg/dL   HDL 27 (L) >40 mg/dL   Total CHOL/HDL Ratio 2.5 RATIO   VLDL 8 0 - 40 mg/dL   LDL Cholesterol 33 0 - 99 mg/dL    Comment:        Total Cholesterol/HDL:CHD Risk Coronary Heart Disease Risk Table                     Men   Women  1/2 Average Risk   3.4   3.3  Average Risk       5.0   4.4  2 X Average Risk   9.6   7.1  3 X Average Risk  23.4   11.0        Use the calculated Patient Ratio above and the CHD Risk Table to determine the patient's CHD Risk.        ATP III CLASSIFICATION (LDL):  <100     mg/dL   Optimal  100-129  mg/dL   Near or Above                    Optimal  130-159  mg/dL   Borderline  160-189  mg/dL   High  >190     mg/dL   Very High Performed at [facility] [address] [place], [place] [address]   Hemoglobin A1c     Status: None  Collection Time: 09/14/20 11:27 PM  Result Value Ref Range    Hgb A1c MFr Bld 5.1 4.8 - 5.6 %    Comment: (NOTE) Pre diabetes:          5.7%-6.4%  Diabetes:              >6.4%  Glycemic control for   <7.0% adults with diabetes    Mean Plasma Glucose 99.67 mg/dL    Comment: Performed at [facility] [address], [place], [place] [address]  Hemoglobin and hematocrit, blood     Status: Abnormal   Collection Time: 09/14/20 11:27 PM  Result Value Ref Range   Hemoglobin 8.7 (L) 12.0 - 15.0 g/dL   HCT 26.2 (L) 36.0 - 46.0 %    Comment: Performed at [facility], [place] [address], [place], [place] [address]  Hemoglobin A1c     Status: None   Collection Time: 09/16/20  6:37 AM  Result Value Ref Range   Hgb A1c MFr Bld 5.0 4.8 - 5.6 %    Comment: (NOTE) Pre diabetes:          5.7%-6.4%  Diabetes:              >6.4%  Glycemic control for   <7.0% adults with diabetes    Mean Plasma Glucose 96.8 mg/dL    Comment: Performed at [facility] [address] [place], [place] [address]  Lipid panel     Status: Abnormal   Collection Time: 09/16/20  6:37 AM  Result Value Ref Range   Cholesterol 64 0 - 200 mg/dL   Triglycerides 41 <150 mg/dL   HDL 27 (L) >40 mg/dL   Total CHOL/HDL Ratio 2.4 RATIO   VLDL 8 0 - 40 mg/dL   LDL Cholesterol 29 0 - 99 mg/dL    Comment:        Total Cholesterol/HDL:CHD Risk Coronary Heart Disease Risk Table                     Men   Women  1/2 Average Risk   3.4   3.3  Average Risk       5.0   4.4  2 X Average Risk   9.6   7.1  3 X Average Risk  23.4   11.0        Use the calculated Patient Ratio above and the CHD Risk Table to determine the patient's CHD Risk.        ATP III CLASSIFICATION (LDL):  <100     mg/dL   Optimal  100-129  mg/dL   Near or Above                    Optimal  130-159  mg/dL   Borderline  160-189  mg/dL   High  >190     mg/dL   Very High Performed at [facility] [address] [place], [place] [address]   TSH     Status: None    Collection Time: 09/16/20  6:37 AM  Result Value Ref Range   TSH 3.622 0.350 - 4.500 uIU/mL    Comment: Performed by a 3rd Generation assay with a functional sensitivity of <=0.01 uIU/mL. Performed at [facility], [place] [address], [place], [place] [address]   Hemoglobin and hematocrit, blood     Status: Abnormal   Collection Time: 09/16/20  6:37 AM  Result Value Ref  Range   Hemoglobin 8.9 (L) 12.0 - 15.0 g/dL   HCT 27.9 (L) 36.0 - 46.0 %    Comment: Performed at [facility], [place] [address] [place], [place] [address]    Blood Alcohol level:  Lab Results  Component Value Date   ETH <10 93/57/0177    Metabolic Disorder Labs: Lab Results  Component Value Date   HGBA1C 5.0 09/16/2020   MPG 96.8 09/16/2020   MPG 99.67 09/14/2020   No results found for: PROLACTIN Lab Results  Component Value Date   CHOL 64 09/16/2020   TRIG 41 09/16/2020   HDL 27 (L) 09/16/2020   CHOLHDL 2.4 09/16/2020   VLDL 8 09/16/2020   LDLCALC 29 09/16/2020   LDLCALC 33 09/14/2020    Physical Findings: AIMS: Facial and Oral Movements Muscles of Facial Expression: None, normal Lips and Perioral Area: None, normal Jaw: None, normal Tongue: None, normal,Extremity Movements Upper (arms, wrists, hands, fingers): None, normal Lower (legs, knees, ankles, toes): None, normal, Trunk Movements Neck, shoulders, hips: None, normal, Overall Severity Severity of abnormal movements (highest score from questions above): None, normal Incapacitation due to abnormal movements: None, normal Patient's awareness of abnormal movements (rate only patient's report): No Awareness, Dental Status Current problems with teeth and/or dentures?: No Does patient usually wear dentures?: No  CIWA:  CIWA-Ar Total: 3 COWS:     Musculoskeletal: Strength & Muscle Tone: within normal limits Gait & Station: normal Patient leans: N/A  Psychiatric Specialty Exam: Physical Exam Vitals and  nursing note reviewed.  Constitutional:      Appearance: She is obese.  HENT:     Head: Normocephalic and atraumatic.  Pulmonary:     Effort: Pulmonary effort is normal.  Neurological:     General: No focal deficit present.     Mental Status: She is alert and oriented to person, place, and time.     Review of Systems  Blood pressure 119/69, pulse 99, temperature 97.9 F (36.6 C), resp. rate 18, height 5' 5.5" (1.664 m), weight 130.2 kg, SpO2 98 %.Body mass index is 47.03 kg/m.  General Appearance: Disheveled  Eye Contact:  Fair  Speech:  Normal Rate  Volume:  Decreased  Mood:  Depressed and Dysphoric  Affect:  Congruent  Thought Process:  Coherent and Descriptions of Associations: Circumstantial  Orientation:  Full (Time, Place, and Person)  Thought Content:  Logical  Suicidal Thoughts:  Yes.  without intent/plan  Homicidal Thoughts:  No  Memory:  Immediate;   Fair Recent;   Fair Remote;   Fair  Judgement:  Intact  Insight:  Lacking  Psychomotor Activity:  Psychomotor Retardation  Concentration:  Concentration: Fair and Attention Span: Fair  Recall:  [employer] of Knowledge:  Fair  Language:  Good  Akathisia:  Negative  Handed:  Right  AIMS (if indicated):     Assets:  Desire for Improvement Resilience  ADL's:  Intact  Cognition:  WNL  Sleep:  Number of Hours: 5     Treatment Plan Summary: Daily contact with patient to assess and evaluate symptoms and progress in treatment, Medication management and Plan : Patient is seen and examined.  Patient is a 30 year old female with the above-stated past psychiatric history who is seen in follow-up.  Diagnosis: 1. Major depression, recurrent, severe without psychotic features 2. Posttraumatic stress disorder. 3. Probable borderline personality disorder. 4. Unspecified tachycardia (with EKGs here showing a sinus tachycardia). 5. History of hypertension. 6. History of hepatic steatosis with mildly  elevated liver  function enzymes. 7. Probable diabetes  Pertinent findings on examination today: 1.  She admits to suicidal ideation today, but was much more cooperative, and not so threatening with regard to acting out. 2.  She is sedated this morning but does get up later in the morning.  She states she felt as though the medications were making her sleepy. 3.  Her hemoglobin and hematocrit after her most recent significant bleed has stabilized, and actually gone up slightly. 4.  Mild hypothyroidism 5.  Hemoglobin A1c at 5.0.  Plan: 1.  Increase Cymbalta to 40 mg p.o. nightly starting on 09/17/2020.  She is already received 30 mg this morning, and we will give her an additional 30 mg at bedtime tonight.  This is for depression and anxiety. 2.  We will go on and start the patient on supplemental iron now that we can see the blood loss from her self-inflicted injuries.  This is for her anemia. 3.  Continue Toprol-XL 100 mg p.o. daily for hypertension and tachycardia. 4.  Continue Neosporin ointment to the self-inflicted wound. 5.  Continue Zyprexa Zydis 5 mg p.o. or Zyprexa 5 mg IM every 6 hours as needed agitation. 6.  Continue Protonix 40 mg p.o. daily for gastric protection. 7.  Decrease melatonin to 3 mg p.o. nightly as needed insomnia. 8.  Continue trazodone 50 mg p.o. nightly as needed insomnia. 9.  Order T3 and T4 with regard to elevated TSH. 10.  Disposition planning-continue with [facility]. 11.  Length of stay-unknown  Sharma Covert, MD 09/16/2020, 1:18 PM

## 2020-09-16 NOTE — Progress Notes (Signed)
1:1Note Pt has had a good night, woke up and went for lab draw. No behavioral issue noted. Remain on 1:1 for safety, will continue to monitor.

## 2020-09-16 NOTE — Progress Notes (Signed)
Nursing 1:1 note D:Pt observed sitting on bed and speaking with this Probation officer. Pt in NAD at this time. Pt has taken all nighttime meds and PRNs as appropriate. A: 1:1 observation continues for safety  R: Pt remains safe
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
## 2020-09-17 LAB — T3 UPTAKE: T3 Uptake Ratio: 22 % — ABNORMAL LOW (ref 24–39)

## 2020-09-17 MED ORDER — HALOPERIDOL LACTATE 5 MG/ML IJ SOLN: 10.0000 mg | Freq: Once | INTRAMUSCULAR | Status: AC

## 2020-09-17 MED ORDER — OLANZAPINE 2.5 MG PO TABS
2.5000 mg | ORAL_TABLET | Freq: Every day | ORAL | Status: DC
  Administered 2020-09-18 – 2020-09-23 (×6): 2.5 mg via ORAL
  Filled 2020-09-17 (×11): qty 1

## 2020-09-17 MED ORDER — CIPROFLOXACIN-HYDROCORTISONE 0.2-1 % OT SUSP
3.0000 [drp] | Freq: Two times a day (BID) | OTIC | Status: DC
  Administered 2020-09-17 – 2020-09-23 (×11): 3 [drp] via OTIC
  Filled 2020-09-17 (×2): qty 10

## 2020-09-17 MED ORDER — LORAZEPAM 2 MG/ML IJ SOLN
INTRAMUSCULAR | Status: AC
  Administered 2020-09-17: 2 mg via INTRAMUSCULAR
  Filled 2020-09-17: qty 1

## 2020-09-17 MED ORDER — LORAZEPAM 2 MG/ML IJ SOLN: 2.0000 mg | Freq: Once | INTRAMUSCULAR | Status: AC

## 2020-09-17 MED ORDER — HALOPERIDOL LACTATE 5 MG/ML IJ SOLN
INTRAMUSCULAR | Status: AC
  Administered 2020-09-17: 10 mg via INTRAMUSCULAR
  Filled 2020-09-17: qty 2

## 2020-09-17 NOTE — Progress Notes (Signed)
1:1 note  Pt has been more reclusive to their room after morning med pass. Pt still complains of ear discomfort. Ear drops provided. Pt denies si/hi/ah/vh and verbally agrees to approach staff if these become apparent or before harming self/others at bhh. Pt safe on the unit. q27m safety checks implemented and continued. Will continue to monitor.

## 2020-09-17 NOTE — Progress Notes (Signed)
Patient ID: Mackenzie Whitaker, female   DOB: 1990/08/24, 30 y.o.   MRN: [id number]    Patient had episode of self injury. Patient was banging her head. She was quickly stopped by staff as she was already on 1:1 and escorted to quiet room for safety. She was administered 10 mg of haldol and 2 mg of ativan for treatment.     Patient was then evaluated by Probation officer. She denied pain in her head, but complained of "voices" bothering her. Patient had not expressed voices prior to today's encounter. She was informed that medications would be added to her regimen to help with her increased symptoms.   Patient denied any symptoms of headache or blurry vision. She was informed to let staff know if anything develops.

## 2020-09-17 NOTE — [facility] Counselor (Signed)
CSW faxed updated medical records to [facility] for review.  Per [place] this patient has been placed on their wait list, however was not placed on priority wait list.  CSW spoke with Dianne who reported she would review her records and call with decision if she will be placed on priority wait list.   Darletta Moll MSW, [facility]

## 2020-09-17 NOTE — Progress Notes (Signed)
Nursing 1:1 note D:Pt observed sleeping in bed with eyes closed. RR even and unlabored. No distress noted. A: 1:1 observation continues for safety  R: Pt remains safe  

## 2020-09-17 NOTE — Progress Notes (Signed)
Nursing 1:1 note D:Pt observed sleeping in bed with eyes closed. RR even and unlabored.  A: 1:1 observation continues for safety  R: Pt remains safe

## 2020-09-17 NOTE — Progress Notes (Signed)
Recreation Therapy Notes  Date: 2.15.22 Time: 0950 Location: [address] Dayroom   Group Topic: Leisure Education  Goal Area(s) Addresses:  Patient will identify positive leisure activities for use post discharge. Patient will identify at skills used to complete activity. Patient will work effectively work with peers to reach shared goal.  Scientist, water quality Response: Engaged  Intervention: [place], Tax adviser, music  Activity: Keep It Chartered certified accountant.  In a circle, patients will toss a beach ball back and forth to each other.  Patients are to keep the ball in rotation without it coming to a full stop.  LRT will keep count of the number hits made on the ball.  Patients can bounce the ball off the floor or wall as long as it does not stop.  If the ball stops, the count will start over from the beginning.  Education:  Leisure Education, Clinical research associate, Engineer, drilling, Dentist  Education Outcome: Acknowledges education/In group clarification offered/Needs additional education.   Clinical Observations/Feedback: Pt was bright and engaged in group activity.  Pt was smiling and worked well with peers.  Pt stated it took teamwork and concentration to complete the activity.  Pt appeared to be enjoying herself during the activity.    Victorino Sparrow, LRT/CTRS    Victorino Sparrow A 09/17/2020 11:49 AM

## 2020-09-17 NOTE — Progress Notes (Signed)
1:1 note  This writer was called to pt's room around 1600. Pt stated they were hearing voices and they were telling the pt to hurt themselves. While this writer went to obtain medication, the pt began banging their head on the wall. Pt was willingly escorted to the quiet room. Pt was given IM medication once. Pt requested to stay in quiet room with to rest. Mattress was provided. 1:1 stayed with the pt for safety. Pt resting now. Will continue to monitor. q85m safety checks implemented and continued. Will continue to monitor.

## 2020-09-17 NOTE — Progress Notes (Signed)
Patient ID: Mackenzie Whitaker, female   DOB: 08-03-1991, 30 y.o.   MRN: [id number]   [facility] MD Progress Note  09/17/2020 12:28 PM Mackenzie Whitaker  MRN:  [id number] INFO:  Patient is a 30 year old female with a past psychiatric history significant for depression, posttraumatic stress disorder, suspected borderline personality disorder who was admitted on 09/13/2020 after a self-inflicted injury for suicide attempt.  DAILY NOTE:   Patient seen, chart reviewed and case discussed with treatment team. Patient states that she is feeling somewhat better although remains quite depressed with low mood and frequent SI. Patient is still on one to one at this time, and appears to be benefitting from the therapeutic interventions of the staff.   She is eating and sleeping okay. Attends some groups but isolates in her room as well.   Today patient complained about ear pain and reveled that she has some pus in her ear. Topical antibiotics/hydorcortisone ordered.    Principal Problem: <principal problem not specified> Diagnosis: Active Problems:   Severe recurrent major depression without psychotic features (HCC)   Recurrent major depression-severe (HCC)  Total Time spent with patient: 20 minutes  Past Psychiatric History: See admission H&P  Past Medical History:  Past Medical History:  Diagnosis Date  . Diabetes mellitus ([place])   . Fatty liver disease, nonalcoholic   . GAD (generalized anxiety disorder)   . History of cardioversion   . HTN (hypertension)   . MDD (major depressive disorder)   . PTSD (post-traumatic stress disorder)   . Sinus tachycardia     Past Surgical History:  Procedure Laterality Date  . APPENDECTOMY    . SKIN CANCER EXCISION     Family History: History reviewed. No pertinent family history. Family Psychiatric  History: See admission H&P Social History:  Social History   Substance and Sexual Activity  Alcohol Use Not Currently     Social History   Substance and Sexual  Activity  Drug Use Not Currently    Social History   Socioeconomic History  . Marital status: Single    Spouse name: Not on file  . Number of children: Not on file  . Years of education: Not on file  . Highest education level: Not on file  Occupational History  . Not on file  Tobacco Use  . Smoking status: Never Smoker  . Smokeless tobacco: Never Used  Vaping Use  . Vaping Use: Never used  Substance and Sexual Activity  . Alcohol use: Not Currently  . Drug use: Not Currently  . Sexual activity: Not Currently  Other Topics Concern  . Not on file  Social History Narrative  . Not on file   Social Determinants of Health   Financial Resource Strain: Not on file  Food Insecurity: Not on file  Transportation Needs: Not on file  Physical Activity: Not on file  Stress: Not on file  Social Connections: Not on file   Additional Social History:                         Sleep: Fair  Appetite:  Good  Current Medications: Current Facility-Administered Medications  Medication Dose Route Frequency Provider Last Rate Last Admin  . acetaminophen (TYLENOL) tablet 650 mg  650 mg Oral Q6H PRN Lindon Romp A, NP      . alum & mag hydroxide-simeth (MAALOX/MYLANTA) 200-200-20 MG/5ML suspension 30 mL  30 mL Oral Q4H PRN Rozetta Nunnery, NP      . ciprofloxacin-hydrocortisone (CIPRO  HC OTIC) 0.2-1 % OTIC (EAR) suspension 3 drop  3 drop Right EAR BID Hendel Gatliff A, MD      . DULoxetine (CYMBALTA) DR capsule 40 mg  40 mg Oral QHS Sharma Covert, MD      . ferrous sulfate tablet 325 mg  325 mg Oral BID WC Sharma Covert, MD   325 mg at 09/17/20 6789  . hydrOXYzine (ATARAX/VISTARIL) tablet 25 mg  25 mg Oral TID PRN Rozetta Nunnery, NP   25 mg at 09/16/20 1856  . LORazepam (ATIVAN) tablet 1 mg  1 mg Oral Q6H PRN Sharma Covert, MD   1 mg at 09/16/20 2100  . magnesium hydroxide (MILK OF MAGNESIA) suspension 30 mL  30 mL Oral Daily PRN Lindon Romp A, NP      . melatonin  tablet 3 mg  3 mg Oral QHS PRN Sharma Covert, MD      . metoprolol succinate (TOPROL-XL) 24 hr tablet 100 mg  100 mg Oral Daily Sharma Covert, MD   100 mg at 09/17/20 0941  . neomycin-bacitracin-polymyxin (NEOSPORIN) ointment packet   Topical BID Sharma Covert, MD   Given at 09/17/20 [phone]  . OLANZapine (ZYPREXA) injection 5 mg  5 mg Intramuscular Q6H PRN Sharma Covert, MD      . pantoprazole (PROTONIX) EC tablet 40 mg  40 mg Oral Daily Sharma Covert, MD   40 mg at 09/17/20 0943  . traZODone (DESYREL) tablet 50 mg  50 mg Oral QHS PRN Rozetta Nunnery, NP   50 mg at 09/16/20 2100    Lab Results:  Results for orders placed or performed during the hospital encounter of 09/13/20 (from the past 48 hour(s))  Hemoglobin A1c     Status: None   Collection Time: 09/16/20  6:37 AM  Result Value Ref Range   Hgb A1c MFr Bld 5.0 4.8 - 5.6 %    Comment: (NOTE) Pre diabetes:          5.7%-6.4%  Diabetes:              >6.4%  Glycemic control for   <7.0% adults with diabetes    Mean Plasma Glucose 96.8 mg/dL    Comment: Performed at [facility], [place] [address] [place], [place] [address]  Lipid panel     Status: Abnormal   Collection Time: 09/16/20  6:37 AM  Result Value Ref Range   Cholesterol 64 0 - 200 mg/dL   Triglycerides 41 <150 mg/dL   HDL 27 (L) >40 mg/dL   Total CHOL/HDL Ratio 2.4 RATIO   VLDL 8 0 - 40 mg/dL   LDL Cholesterol 29 0 - 99 mg/dL    Comment:        Total Cholesterol/HDL:CHD Risk Coronary Heart Disease Risk Table                     Men   Women  1/2 Average Risk   3.4   3.3  Average Risk       5.0   4.4  2 X Average Risk   9.6   7.1  3 X Average Risk  23.4   11.0        Use the calculated Patient Ratio above and the CHD Risk Table to determine the patient's CHD Risk.        ATP III CLASSIFICATION (LDL):  <100     mg/dL   Optimal  100-129  mg/dL   Near or Above                    Optimal  130-159  mg/dL   Borderline  160-189  mg/dL    High  >190     mg/dL   Very High Performed at [facility] [address] [place], [place] [address]   TSH     Status: None   Collection Time: 09/16/20  6:37 AM  Result Value Ref Range   TSH 3.622 0.350 - 4.500 uIU/mL    Comment: Performed by a 3rd Generation assay with a functional sensitivity of <=0.01 uIU/mL. Performed at [facility], [place] [address], [place], [place] [address]   Hemoglobin and hematocrit, blood     Status: Abnormal   Collection Time: 09/16/20  6:37 AM  Result Value Ref Range   Hemoglobin 8.9 (L) 12.0 - 15.0 g/dL   HCT 27.9 (L) 36.0 - 46.0 %    Comment: Performed at [facility], [place] [address] [place], [place] [address]  T3 uptake     Status: Abnormal   Collection Time: 09/16/20  5:58 PM  Result Value Ref Range   T3 Uptake Ratio 22 (L) 24 - 39 %    Comment: (NOTE) Performed At: [facility] [place], [place] [id number] Rush Farmer MD IW:[id number]   T4, free     Status: Abnormal   Collection Time: 09/16/20  5:58 PM  Result Value Ref Range   Free T4 1.17 (H) 0.61 - 1.12 ng/dL    Comment: (NOTE) Biotin ingestion may interfere with free T4 tests. If the results are inconsistent with the TSH level, previous test results, or the clinical presentation, then consider biotin interference. If needed, order repeat testing after stopping biotin. Performed at [facility], [place] [address], [place], [place] [address]     Blood Alcohol level:  Lab Results  Component Value Date   ETH <10 14/48/1856    Metabolic Disorder Labs: Lab Results  Component Value Date   HGBA1C 5.0 09/16/2020   MPG 96.8 09/16/2020   MPG 99.67 09/14/2020   No results found for: PROLACTIN Lab Results  Component Value Date   CHOL 64 09/16/2020   TRIG 41 09/16/2020   HDL 27 (L) 09/16/2020   CHOLHDL 2.4 09/16/2020   VLDL 8 09/16/2020   LDLCALC 29 09/16/2020   LDLCALC 33 09/14/2020     Physical Findings: AIMS: Facial and Oral Movements Muscles of Facial Expression: None, normal Lips and Perioral Area: None, normal Jaw: None, normal Tongue: None, normal,Extremity Movements Upper (arms, wrists, hands, fingers): None, normal Lower (legs, knees, ankles, toes): None, normal, Trunk Movements Neck, shoulders, hips: None, normal, Overall Severity Severity of abnormal movements (highest score from questions above): None, normal Incapacitation due to abnormal movements: None, normal Patient's awareness of abnormal movements (rate only patient's report): No Awareness, Dental Status Current problems with teeth and/or dentures?: No Does patient usually wear dentures?: No  CIWA:  CIWA-Ar Total: 3 COWS:     Musculoskeletal: Strength & Muscle Tone: within normal limits Gait & Station: normal Patient leans: N/A  Psychiatric Specialty Exam: Physical Exam Vitals and nursing note reviewed.  Constitutional:      Appearance: She is obese.  HENT:     Head: Normocephalic and atraumatic.  Pulmonary:     Effort: Pulmonary effort is normal.  Neurological:     General: No focal deficit present.  Mental Status: She is alert and oriented to person, place, and time.     Review of Systems  Blood pressure 125/85, pulse (!) 114, temperature 97.9 F (36.6 C), resp. rate 18, height 5' 5.5" (1.664 m), weight 130.2 kg, SpO2 98 %.Body mass index is 47.03 kg/m.  General Appearance: Disheveled  Eye Contact:  Fair  Speech:  Normal Rate  Volume:  Decreased  Mood:  Depressed and Dysphoric  Affect:  Congruent  Thought Process:  Coherent and Descriptions of Associations: Circumstantial  Orientation:  Full (Time, Place, and Person)  Thought Content:  Logical  Suicidal Thoughts:  Yes.  without intent/plan  Homicidal Thoughts:  No  Memory:  Immediate;   Fair Recent;   Fair Remote;   Fair  Judgement:  Intact  Insight:  Lacking  Psychomotor Activity:  Psychomotor Retardation   Concentration:  Concentration: Fair and Attention Span: Fair  Recall:  [employer] of Knowledge:  Fair  Language:  Good  Akathisia:  Negative  Handed:  Right  AIMS (if indicated):     Assets:  Desire for Improvement Resilience  ADL's:  Intact  Cognition:  WNL  Sleep:  Number of Hours: 7     Treatment Plan Summary: Daily contact with patient to assess and evaluate symptoms and progress in treatment, Medication management and Plan : Patient is seen and examined.  Patient is a 30 year old female with the above-stated past psychiatric history who is seen in follow-up.  Diagnosis: 1. Major depression, recurrent, severe without psychotic features 2. Posttraumatic stress disorder. 3. Probable borderline personality disorder. 4. Unspecified tachycardia (with EKGs here showing a sinus tachycardia). 5. History of hypertension. 6. History of hepatic steatosis with mildly elevated liver function enzymes. 7. Probable diabetes    Plan: 1.  Continue  Cymbalta to 40 mg p.o. nightly starting on 09/17/2020.  She is already received 30 mg this morning, and we will give her an additional 30 mg at bedtime tonight.  This is for depression and anxiety. 2.  Continue on supplemental iron now that we can see the blood loss from her self-inflicted injuries.  This is for her anemia. 3.  Continue Toprol-XL 100 mg p.o. daily for hypertension and tachycardia. 4.  Continue Neosporin ointment to the self-inflicted wound. 5.  Continue Zyprexa Zydis 5 mg p.o. or Zyprexa 5 mg IM every 6 hours as needed agitation. 6.  Continue Protonix 40 mg p.o. daily for gastric protection. 7.  Decrease melatonin to 3 mg p.o. nightly as needed insomnia. 8.  Continue trazodone 50 mg p.o. nightly as needed insomnia. 9.  Order T3 and T4 with regard to elevated TSH. 10. CIPRO HC ordered BID for right ear infection. 11.  Disposition planning-continue with [facility]. 12.  Length of stay-unknown  Dixie Dials, MD 09/17/2020, 12:28 PM

## 2020-09-17 NOTE — Progress Notes (Signed)
1:1 note  Pt found in bed; allowed to rest. Upon awakening, pt was compliant with medication administration. Pt was guarded and minimal during assessment. Pt was fidgety and guarded. Pt was provided wound care. Pt had complaints of an ear ache that has been going on "for the last 3 years". Pt was provided cotton balls as requested. Pt denied si/hi/ah/vh and verbally agrees to approach staff if these become apparent. Pt safe on the unit. q39m safety checks implemented and continued. Will continue to monitor.

## 2020-09-17 NOTE — Plan of Care (Signed)
  Problem: Health Behavior/Discharge Planning: Goal: Compliance with treatment plan for underlying cause of condition will improve Outcome: Progressing   Problem: Physical Regulation: Goal: Ability to maintain clinical measurements within normal limits will improve Outcome: Progressing   Problem: Safety: Goal: Periods of time without injury will increase Outcome: Progressing

## 2020-09-18 MED ORDER — FLUOXETINE HCL 10 MG PO CAPS
10.0000 mg | ORAL_CAPSULE | Freq: Every day | ORAL | Status: DC
  Administered 2020-09-19 – 2020-09-20 (×2): 10 mg via ORAL
  Filled 2020-09-18 (×3): qty 1

## 2020-09-18 NOTE — Progress Notes (Signed)
Pt observed lying in bed, reading a romance novel.  Pt's goal for today was to read a romance novel, and Marjette, Recreation Therapist was able to find some books that pt could read to distract pt's  Mind/  Pt has not had any self harm events or anger outbursts today.  Pt remains safe with 1:1 supervision in place.

## 2020-09-18 NOTE — [facility] Group Notes (Signed)
[facility] LCSW Group Therapy  09/18/2020 2:10 PM  Type of Therapy:  Movement Therapy  Participation Level:  Did Not Attend   Summary of Progress/Problems: Patient did not attend.  Ellyce Lafevers A Kinsey Karch 09/18/2020, 2:10 PM

## 2020-09-18 NOTE — Progress Notes (Signed)
Adult Psychoeducational Group Note  Date:  09/18/2020 Time:  11:21 PM  Group Topic/Focus:  Wrap-Up Group:   The focus of this group is to help patients review their daily goal of treatment and discuss progress on daily workbooks.  Participation Level:  Did Not Attend  Participation Quality:  Did Not Attend  Affect:  Did Not Attend  Cognitive:  Did Not Attend  Insight: None  Engagement in Group:  Did Not Attend  Modes of Intervention:  Did Not Attend  Additional Comments:  Pt did not attend evening wrap up group tonight.  [name] 09/18/2020, 11:21 PM

## 2020-09-18 NOTE — Progress Notes (Signed)
Patient ID: Mackenzie Whitaker, female   DOB: Jul 08, 1991, 30 y.o.   MRN: [id number]   [facility] MD Progress Note  09/18/2020 2:08 PM Marlyss Cissell  MRN:  [id number] INFO:  Patient is a 30 year old female with a past psychiatric history significant for depression, posttraumatic stress disorder, suspected borderline personality disorder who was admitted on 09/13/2020 after a self-inflicted injury for suicide attempt.  DAILY NOTE:   Patient seen, chart reviewed and case discussed with treatment team.    Patient reports still feeling very depressed with frequent SI. Patient is still on one to one at this time, and appears to be benefitting from the therapeutic interventions of the staff.  Had an episode of self injury yesterday, which responded well to medication.   She is eating and sleeping okay. Attends some groups but isolates in her room as well.   Today patient reported ear pain is decreasing.   Patient refused her Cymbalta this morning. She reports that she doesn't like the way the medications make her feel. She is agreeable to change in medication but also requests ECT.   Principal Problem: <principal problem not specified> Diagnosis: Active Problems:   Severe recurrent major depression without psychotic features (HCC)   Recurrent major depression-severe (HCC)  Total Time spent with patient: 20 minutes  Past Psychiatric History: See admission H&P  Past Medical History:  Past Medical History:  Diagnosis Date  . Diabetes mellitus ([place])   . Fatty liver disease, nonalcoholic   . GAD (generalized anxiety disorder)   . History of cardioversion   . HTN (hypertension)   . MDD (major depressive disorder)   . PTSD (post-traumatic stress disorder)   . Sinus tachycardia     Past Surgical History:  Procedure Laterality Date  . APPENDECTOMY    . SKIN CANCER EXCISION     Family History: History reviewed. No pertinent family history. Family Psychiatric  History: See admission H&P Social  History:  Social History   Substance and Sexual Activity  Alcohol Use Not Currently     Social History   Substance and Sexual Activity  Drug Use Not Currently    Social History   Socioeconomic History  . Marital status: Single    Spouse name: Not on file  . Number of children: Not on file  . Years of education: Not on file  . Highest education level: Not on file  Occupational History  . Not on file  Tobacco Use  . Smoking status: Never Smoker  . Smokeless tobacco: Never Used  Vaping Use  . Vaping Use: Never used  Substance and Sexual Activity  . Alcohol use: Not Currently  . Drug use: Not Currently  . Sexual activity: Not Currently  Other Topics Concern  . Not on file  Social History Narrative  . Not on file   Social Determinants of Health   Financial Resource Strain: Not on file  Food Insecurity: Not on file  Transportation Needs: Not on file  Physical Activity: Not on file  Stress: Not on file  Social Connections: Not on file   Additional Social History:                         Sleep: Fair  Appetite:  Good  Current Medications: Current Facility-Administered Medications  Medication Dose Route Frequency Provider Last Rate Last Admin  . acetaminophen (TYLENOL) tablet 650 mg  650 mg Oral Q6H PRN Rozetta Nunnery, NP      . alum &  mag hydroxide-simeth (MAALOX/MYLANTA) 200-200-20 MG/5ML suspension 30 mL  30 mL Oral Q4H PRN Lindon Romp A, NP      . ciprofloxacin-hydrocortisone (CIPRO HC OTIC) 0.2-1 % OTIC (EAR) suspension 3 drop  3 drop Right EAR BID Genine Beckett, Dorene Ar, MD   3 drop at 09/18/20 0732  . ferrous sulfate tablet 325 mg  325 mg Oral BID WC Sharma Covert, MD   325 mg at 09/18/20 0732  . [START ON 09/19/2020] FLUoxetine (PROZAC) capsule 10 mg  10 mg Oral Daily Maron Stanzione A, MD      . hydrOXYzine (ATARAX/VISTARIL) tablet 25 mg  25 mg Oral TID PRN Lindon Romp A, NP   25 mg at 09/18/20 0732  . LORazepam (ATIVAN) tablet 1 mg  1 mg Oral  Q6H PRN Sharma Covert, MD   1 mg at 09/18/20 0732  . magnesium hydroxide (MILK OF MAGNESIA) suspension 30 mL  30 mL Oral Daily PRN Lindon Romp A, NP      . melatonin tablet 3 mg  3 mg Oral QHS PRN Sharma Covert, MD      . metoprolol succinate (TOPROL-XL) 24 hr tablet 100 mg  100 mg Oral Daily Sharma Covert, MD   100 mg at 09/18/20 0732  . neomycin-bacitracin-polymyxin (NEOSPORIN) ointment packet   Topical BID Sharma Covert, MD   Given at 09/18/20 0732  . OLANZapine (ZYPREXA) tablet 2.5 mg  2.5 mg Oral QHS Toben Acuna A, MD      . pantoprazole (PROTONIX) EC tablet 40 mg  40 mg Oral Daily Sharma Covert, MD   40 mg at 09/18/20 0732    Lab Results:  Results for orders placed or performed during the hospital encounter of 09/13/20 (from the past 48 hour(s))  T3 uptake     Status: Abnormal   Collection Time: 09/16/20  5:58 PM  Result Value Ref Range   T3 Uptake Ratio 22 (L) 24 - 39 %    Comment: (NOTE) Performed At: [facility] [place], [place] [id number] Rush Farmer MD [id number]   T4, free     Status: Abnormal   Collection Time: 09/16/20  5:58 PM  Result Value Ref Range   Free T4 1.17 (H) 0.61 - 1.12 ng/dL    Comment: (NOTE) Biotin ingestion may interfere with free T4 tests. If the results are inconsistent with the TSH level, previous test results, or the clinical presentation, then consider biotin interference. If needed, order repeat testing after stopping biotin. Performed at [facility], [place] [address], [place], [place] [address]     Blood Alcohol level:  Lab Results  Component Value Date   ETH <10 70/26/3785    Metabolic Disorder Labs: Lab Results  Component Value Date   HGBA1C 5.0 09/16/2020   MPG 96.8 09/16/2020   MPG 99.67 09/14/2020   No results found for: PROLACTIN Lab Results  Component Value Date   CHOL 64 09/16/2020   TRIG 41 09/16/2020   HDL 27 (L) 09/16/2020   CHOLHDL 2.4 09/16/2020    VLDL 8 09/16/2020   LDLCALC 29 09/16/2020   LDLCALC 33 09/14/2020    Physical Findings: AIMS: Facial and Oral Movements Muscles of Facial Expression: None, normal Lips and Perioral Area: None, normal Jaw: None, normal Tongue: None, normal,Extremity Movements Upper (arms, wrists, hands, fingers): None, normal Lower (legs, knees, ankles, toes): None, normal, Trunk Movements Neck, shoulders, hips: None, normal, Overall Severity Severity of abnormal movements (highest score from  questions above): None, normal Incapacitation due to abnormal movements: None, normal Patient's awareness of abnormal movements (rate only patient's report): No Awareness, Dental Status Current problems with teeth and/or dentures?: No Does patient usually wear dentures?: No  CIWA:  CIWA-Ar Total: 3 COWS:     Musculoskeletal: Strength & Muscle Tone: within normal limits Gait & Station: normal Patient leans: N/A  Psychiatric Specialty Exam: Physical Exam Vitals and nursing note reviewed.  Constitutional:      Appearance: She is obese.  HENT:     Head: Normocephalic and atraumatic.  Pulmonary:     Effort: Pulmonary effort is normal.  Neurological:     General: No focal deficit present.     Mental Status: She is alert and oriented to person, place, and time.     Review of Systems  Blood pressure (!) 115/95, pulse (!) 109, temperature 97.9 F (36.6 C), resp. rate 18, height 5' 5.5" (1.664 m), weight 130.2 kg, SpO2 98 %.Body mass index is 47.03 kg/m.  General Appearance: Disheveled  Eye Contact:  Fair  Speech:  Normal Rate  Volume:  Decreased  Mood:  Depressed and Dysphoric  Affect:  Congruent  Thought Process:  Coherent and Descriptions of Associations: Circumstantial  Orientation:  Full (Time, Place, and Person)  Thought Content:  Logical  Suicidal Thoughts:  Yes.  without intent/plan  Homicidal Thoughts:  No  Memory:  Immediate;   Fair Recent;   Fair Remote;   Fair  Judgement:  Poor   Insight:  Lacking  Psychomotor Activity:  Psychomotor Retardation  Concentration:  Concentration: Fair and Attention Span: Fair  Recall:  [employer] of Knowledge:  Fair  Language:  Good  Akathisia:  Negative  Handed:  Right  AIMS (if indicated):     Assets:  Desire for Improvement Resilience  ADL's:  Intact  Cognition:  WNL  Sleep:  Number of Hours: 6.75     Treatment Plan Summary: Daily contact with patient to assess and evaluate symptoms and progress in treatment, Medication management and Plan : Patient is seen and examined.  Patient is a 30 year old female with the above-stated past psychiatric history who is seen in follow-up.  Diagnosis: 1. Major depression, recurrent, severe with psychotic features 2. Posttraumatic stress disorder. 3. Probable borderline personality disorder. 4. Unspecified tachycardia (with EKGs here showing a sinus tachycardia). 5. History of hypertension. 6. History of hepatic steatosis with mildly elevated liver function enzymes. 7. Probable diabetes    Plan: 1.  Will discontinue cymbalta s patient is refusing tot ake the medication. Wil start prozac at 10mg  qam. This is for depression and anxiety. Will also add zyprexa 2.5mg  qhs to her regimen for reported hallucinations as well as mood stabilization.   2.  Continue on supplemental iron now that we can see the blood loss from her self-inflicted injuries.  This is for her anemia. 3.  Continue Toprol-XL 100 mg p.o. daily for hypertension and tachycardia. 4.  Continue Neosporin ointment to the self-inflicted wound. 5.  Continue Zyprexa Zydis 5 mg p.o. or Zyprexa 5 mg IM every 6 hours as needed agitation. 6.  Continue Protonix 40 mg p.o. daily for gastric protection. 7.  Decrease melatonin to 3 mg p.o. nightly as needed insomnia. 8.  D/c trazodone 9.  Order T3 and T4 WNL. 10. CIPRO HC ordered BID for right ear infection x 7 days 11.  Disposition planning-continue with request to have  patient transferred to [facility] hospital for higher level of care. Will also  continue to seek out ECT placement. 12.  Length of stay-unknown  Dixie Dials, MD 09/18/2020, 2:08 PM

## 2020-09-18 NOTE — Progress Notes (Signed)
Pt siting in room with tech closeby pt.  Pt reading a book and appears to be in no acute distress. Pt has not any self-harm events today and she appears to be much more relaxed then yesterday.

## 2020-09-18 NOTE — Tx Team (Signed)
Interdisciplinary Treatment and Diagnostic Plan Update  09/18/2020 Time of Session: 9:20am  Mackenzie Whitaker MRN: [id number]  Principal Diagnosis: <principal problem not specified>  Secondary Diagnoses: Active Problems:   Severe recurrent major depression without psychotic features (HCC)   Recurrent major depression-severe (HCC)   Current Medications:  Current Facility-Administered Medications  Medication Dose Route Frequency Provider Last Rate Last Admin  . acetaminophen (TYLENOL) tablet 650 mg  650 mg Oral Q6H PRN Lindon Romp A, NP      . alum & mag hydroxide-simeth (MAALOX/MYLANTA) 200-200-20 MG/5ML suspension 30 mL  30 mL Oral Q4H PRN Lindon Romp A, NP      . ciprofloxacin-hydrocortisone (CIPRO HC OTIC) 0.2-1 % OTIC (EAR) suspension 3 drop  3 drop Right EAR BID Cristofano, Dorene Ar, MD   3 drop at 09/18/20 0732  . DULoxetine (CYMBALTA) DR capsule 40 mg  40 mg Oral QHS Sharma Covert, MD      . ferrous sulfate tablet 325 mg  325 mg Oral BID WC Sharma Covert, MD   325 mg at 09/18/20 0732  . hydrOXYzine (ATARAX/VISTARIL) tablet 25 mg  25 mg Oral TID PRN Rozetta Nunnery, NP   25 mg at 09/18/20 0732  . LORazepam (ATIVAN) tablet 1 mg  1 mg Oral Q6H PRN Sharma Covert, MD   1 mg at 09/18/20 0732  . magnesium hydroxide (MILK OF MAGNESIA) suspension 30 mL  30 mL Oral Daily PRN Lindon Romp A, NP      . melatonin tablet 3 mg  3 mg Oral QHS PRN Sharma Covert, MD      . metoprolol succinate (TOPROL-XL) 24 hr tablet 100 mg  100 mg Oral Daily Sharma Covert, MD   100 mg at 09/18/20 0732  . neomycin-bacitracin-polymyxin (NEOSPORIN) ointment packet   Topical BID Sharma Covert, MD   Given at 09/18/20 0732  . OLANZapine (ZYPREXA) tablet 2.5 mg  2.5 mg Oral QHS Cristofano, Paul A, MD      . pantoprazole (PROTONIX) EC tablet 40 mg  40 mg Oral Daily Sharma Covert, MD   40 mg at 09/18/20 0732   PTA Medications: Medications Prior to Admission  Medication Sig Dispense Refill  Last Dose  . COLLAGEN PO Take 1 capsule by mouth daily.     . metoprolol succinate (TOPROL-XL) 100 MG 24 hr tablet Take 100 mg by mouth in the morning. Take with or immediately following a meal.     . metoprolol tartrate (LOPRESSOR) 50 MG tablet Take 50 mg by mouth in the morning and at bedtime.     . ondansetron (ZOFRAN-ODT) 4 MG disintegrating tablet Take 4 mg by mouth every 8 (eight) hours as needed for nausea or vomiting (dissolve orally).       Patient Stressors: Financial difficulties Health problems Loss of Dad Medication change or noncompliance  Patient Strengths: Capable of independent living Communication skills Supportive family/friends  Treatment Modalities: Medication Management, Group therapy, Case management,  1 to 1 session with clinician, Psychoeducation, Recreational therapy.   Physician Treatment Plan for Primary Diagnosis: <principal problem not specified> Long Term Goal(s): Improvement in symptoms so as ready for discharge Improvement in symptoms so as ready for discharge   Short Term Goals: Ability to identify changes in lifestyle to reduce recurrence of condition will improve Ability to verbalize feelings will improve Ability to disclose and discuss suicidal ideas Ability to demonstrate self-control will improve Ability to identify and develop effective coping behaviors will improve Ability to  maintain clinical measurements within normal limits will improve Ability to identify changes in lifestyle to reduce recurrence of condition will improve Ability to verbalize feelings will improve Ability to disclose and discuss suicidal ideas Ability to demonstrate self-control will improve Ability to identify and develop effective coping behaviors will improve Ability to maintain clinical measurements within normal limits will improve  Medication Management: Evaluate patient's response, side effects, and tolerance of medication regimen.  Therapeutic Interventions:  1 to 1 sessions, Unit Group sessions and Medication administration.  Evaluation of Outcomes: Not Met  Physician Treatment Plan for Secondary Diagnosis: Active Problems:   Severe recurrent major depression without psychotic features ([place])   Recurrent major depression-severe ([place])  Long Term Goal(s): Improvement in symptoms so as ready for discharge Improvement in symptoms so as ready for discharge   Short Term Goals: Ability to identify changes in lifestyle to reduce recurrence of condition will improve Ability to verbalize feelings will improve Ability to disclose and discuss suicidal ideas Ability to demonstrate self-control will improve Ability to identify and develop effective coping behaviors will improve Ability to maintain clinical measurements within normal limits will improve Ability to identify changes in lifestyle to reduce recurrence of condition will improve Ability to verbalize feelings will improve Ability to disclose and discuss suicidal ideas Ability to demonstrate self-control will improve Ability to identify and develop effective coping behaviors will improve Ability to maintain clinical measurements within normal limits will improve     Medication Management: Evaluate patient's response, side effects, and tolerance of medication regimen.  Therapeutic Interventions: 1 to 1 sessions, Unit Group sessions and Medication administration.  Evaluation of Outcomes: Not Met   RN Treatment Plan for Primary Diagnosis: <principal problem not specified> Long Term Goal(s): Knowledge of disease and therapeutic regimen to maintain health will improve  Short Term Goals: Ability to remain free from injury will improve, Ability to demonstrate self-control, Ability to participate in decision making will improve, Ability to verbalize feelings will improve, Ability to disclose and discuss suicidal ideas and Ability to identify and develop effective coping behaviors will  improve  Medication Management: RN will administer medications as ordered by provider, will assess and evaluate patient's response and provide education to patient for prescribed medication. RN will report any adverse and/or side effects to prescribing provider.  Therapeutic Interventions: 1 on 1 counseling sessions, Psychoeducation, Medication administration, Evaluate responses to treatment, Monitor vital signs and CBGs as ordered, Perform/monitor CIWA, COWS, AIMS and Fall Risk screenings as ordered, Perform wound care treatments as ordered.  Evaluation of Outcomes: Not Met   LCSW Treatment Plan for Primary Diagnosis: <principal problem not specified> Long Term Goal(s): Safe transition to appropriate next level of care at discharge, Engage patient in therapeutic group addressing interpersonal concerns.  Short Term Goals: Engage patient in aftercare planning with referrals and resources, Increase ability to appropriately verbalize feelings, Increase emotional regulation, Facilitate acceptance of mental health diagnosis and concerns, Facilitate patient progression through stages of change regarding substance use diagnoses and concerns and Increase skills for wellness and recovery  Therapeutic Interventions: Assess for all discharge needs, 1 to 1 time with Social worker, Explore available resources and support systems, Assess for adequacy in community support network, Educate family and significant other(s) on suicide prevention, Complete Psychosocial Assessment, Interpersonal group therapy.  Evaluation of Outcomes: Not Met   Progress in Treatment: Attending groups: No. Participating in groups: No. Taking medication as prescribed: Yes. Toleration medication: Yes. Family/Significant other contact made: Yes, individual(s) contacted:  with mother Patient understands  diagnosis: No. Discussing patient identified problems/goals with staff: Yes. Medical problems stabilized or resolved: Yes. Denies  suicidal/homicidal ideation: Yes. Issues/concerns per patient self-inventory: No.   New problem(s) identified: No, Describe:  None   New Short Term/Long Term Goal(s): medication stabilization, elimination of SI thoughts, development of comprehensive mental wellness plan.   Patient Goals:  Pt did not attend  Discharge Plan or Barriers:  Patient is currently on [facility] wait list. Patient is to follow up at [facility] for outpatient therapy and medication management.  Reason for Continuation of Hospitalization: Anxiety Depression Medication stabilization Suicidal ideation  Estimated Length of Stay: 3 to 5 days   Attendees: Patient: 09/13/2020   Physician:  09/13/2020   Nursing:  09/13/2020   RN Care Manager: 09/13/2020   Social Worker: Darletta Moll, LCSW 09/13/2020   Recreational Therapist:  09/13/2020   Other:  09/13/2020   Other:  09/13/2020   Other: 09/13/2020     Scribe for Treatment Team: Vassie Moselle, LCSW 09/18/2020 10:29 AM

## 2020-09-18 NOTE — Progress Notes (Signed)
Recreation Therapy Notes  Date: 2.16.22 Time: 1010 Location: 500 [address] Dayroom  Group Topic: Anxiety  Goal Area(s) Addresses:  Patient will identify what anxiety is. Patient will identify what causes anxiety. Patient will identify coping skills for dealing with anxiety.  Behavioral Response: Engaged  Intervention: Worksheet  Activity: Introduction to Anxiety.  Patient will identify triggers to anxiety, physical symptoms experienced when anxious, thoughts that go through the mind when anxious and coping skills used to deal with anxiety.  Education: Anxiety, Discharge Planning  Education Outcome: Acknowledges understanding/In group clarification offered/Needs additional education.   Clinical Observations/Feedback: Pt was quiet but attentive during group session.  Pt identified triggers as abuse, crowds and bad/intense situations.  Pt expressed her physical symptoms when anxious are cold sweats, shaking and knee bouncing.  Pt stated her thoughts when anxious are "what the fuck", breath and "don't panic".  Pt identified coping skills as rubbing hands together, biting nails and breathing.    Victorino Sparrow, LRT/CTRS    Ria Comment, Darcy Barbara A 09/18/2020 11:15 AM

## 2020-09-18 NOTE — Progress Notes (Signed)
Adult Psychoeducational Group Note  Date:  09/18/2020 Time:  12:10 AM  Group Topic/Focus:  Wrap-Up Group:   The focus of this group is to help patients review their daily goal of treatment and discuss progress on daily workbooks.  Participation Level:  Did Not Attend  Participation Quality:  Did Not Attend  Affect:  Did Not Attend  Cognitive:  Did Not Attend  Insight: None  Engagement in Group:  Did Not Attend  Modes of Intervention:  Did Not Attend  Additional Comments:  Pt did not attend evening wrap up group tonight.  [name] 09/18/2020, 12:10 AM

## 2020-09-18 NOTE — Progress Notes (Signed)
Pt up in dayroom, watching television.  1:1 monitoring of pt still in place.  Pt says "the voices are quiet this morning."  Pt denies SI HI AVH.  Pt is coping well at this time and appears to be in no distress.  Pt taking medications without incident.

## 2020-09-18 NOTE — BHH Counselor (Signed)
CSW faxed additional records to [facility] for review.   CSW spoke with admissions at [facility] who stated that this patient remains on their wait list.   Darletta Moll MSW, [facility]

## 2020-09-19 NOTE — Progress Notes (Signed)
Patient ID: Mackenzie Whitaker, female   DOB: [birth year], 30 y.o.   MRN: [id number]   [facility] MD Progress Note  09/19/2020 2:18 PM Mackenzie Whitaker  MRN:  [id number]  INFO:  Patient is a 30 year old female with a past psychiatric history significant for depression, posttraumatic stress disorder, suspected borderline personality disorder who was admitted on 09/13/2020 after a self-inflicted injury for suicide attempt.  DAILY NOTE:   Patient seen, chart reviewed and case discussed with treatment team.   Patient reports feeling somewhat improved, which she attributes to a good conversation she had with her mother. Patient is still on one to one at this time, and appears to be benefitting from the therapeutic interventions of the staff. Had no episodes of self injury yesterday and asked to come off of 1:1 status. Was told that she will be observed for the remainder of today before decision will be made.  She is eating and sleeping okay. Attends some groups but isolates in her room as well.   Denies any adverse effects of the Prozac or Zyprexa medication.  Today patient reported ear pain is decreasing.     Principal Problem: <principal problem not specified> Diagnosis: Active Problems:   Severe recurrent major depression without psychotic features (HCC)   Recurrent major depression-severe (HCC)  Total Time spent with patient: 20 minutes  Past Psychiatric History: See admission H&P  Past Medical History:  Past Medical History:  Diagnosis Date  . Diabetes mellitus ([place])   . Fatty liver disease, nonalcoholic   . GAD (generalized anxiety disorder)   . History of cardioversion   . HTN (hypertension)   . MDD (major depressive disorder)   . PTSD (post-traumatic stress disorder)   . Sinus tachycardia     Past Surgical History:  Procedure Laterality Date  . APPENDECTOMY    . SKIN CANCER EXCISION     Family History: History reviewed. No pertinent family history. Family Psychiatric  History: See  admission H&P Social History:  Social History   Substance and Sexual Activity  Alcohol Use Not Currently     Social History   Substance and Sexual Activity  Drug Use Not Currently    Social History   Socioeconomic History  . Marital status: Single    Spouse name: Not on file  . Number of children: Not on file  . Years of education: Not on file  . Highest education level: Not on file  Occupational History  . Not on file  Tobacco Use  . Smoking status: Never Smoker  . Smokeless tobacco: Never Used  Vaping Use  . Vaping Use: Never used  Substance and Sexual Activity  . Alcohol use: Not Currently  . Drug use: Not Currently  . Sexual activity: Not Currently  Other Topics Concern  . Not on file  Social History Narrative  . Not on file   Social Determinants of Health   Financial Resource Strain: Not on file  Food Insecurity: Not on file  Transportation Needs: Not on file  Physical Activity: Not on file  Stress: Not on file  Social Connections: Not on file   Additional Social History:                         Sleep: Fair  Appetite:  Good  Current Medications: Current Facility-Administered Medications  Medication Dose Route Frequency Provider Last Rate Last Admin  . acetaminophen (TYLENOL) tablet 650 mg  650 mg Oral Q6H PRN Rozetta Nunnery, NP      .  alum & mag hydroxide-simeth (MAALOX/MYLANTA) 200-200-20 MG/5ML suspension 30 mL  30 mL Oral Q4H PRN Lindon Romp A, NP      . ciprofloxacin-hydrocortisone (CIPRO HC OTIC) 0.2-1 % OTIC (EAR) suspension 3 drop  3 drop Right EAR BID [place], Dorene Ar, MD   3 drop at 09/19/20 0819  . ferrous sulfate tablet 325 mg  325 mg Oral BID WC Sharma Covert, MD   325 mg at 09/19/20 0818  . FLUoxetine (PROZAC) capsule 10 mg  10 mg Oral Daily Oreatha Fabry, Dorene Ar, MD   10 mg at 09/19/20 0819  . hydrOXYzine (ATARAX/VISTARIL) tablet 25 mg  25 mg Oral TID PRN Rozetta Nunnery, NP   25 mg at 09/18/20 2111  . LORazepam (ATIVAN)  tablet 1 mg  1 mg Oral Q6H PRN Sharma Covert, MD   1 mg at 09/18/20 0732  . magnesium hydroxide (MILK OF MAGNESIA) suspension 30 mL  30 mL Oral Daily PRN Lindon Romp A, NP      . melatonin tablet 3 mg  3 mg Oral QHS PRN Sharma Covert, MD   3 mg at 09/18/20 2111  . metoprolol succinate (TOPROL-XL) 24 hr tablet 100 mg  100 mg Oral Daily Sharma Covert, MD   100 mg at 09/19/20 0818  . neomycin-bacitracin-polymyxin (NEOSPORIN) ointment packet   Topical BID Sharma Covert, MD   Given at 09/19/20 0820  . OLANZapine (ZYPREXA) tablet 2.5 mg  2.5 mg Oral QHS Trinten Boudoin, Dorene Ar, MD   2.5 mg at 09/18/20 2111  . pantoprazole (PROTONIX) EC tablet 40 mg  40 mg Oral Daily Sharma Covert, MD   40 mg at 09/19/20 5366    Lab Results:  No results found for this or any previous visit (from the past 48 hour(s)).  Blood Alcohol level:  Lab Results  Component Value Date   ETH <10 44/10/4740    Metabolic Disorder Labs: Lab Results  Component Value Date   HGBA1C 5.0 09/16/2020   MPG 96.8 09/16/2020   MPG 99.67 09/14/2020   No results found for: PROLACTIN Lab Results  Component Value Date   CHOL 64 09/16/2020   TRIG 41 09/16/2020   HDL 27 (L) 09/16/2020   CHOLHDL 2.4 09/16/2020   VLDL 8 09/16/2020   LDLCALC 29 09/16/2020   LDLCALC 33 09/14/2020    Physical Findings: AIMS: Facial and Oral Movements Muscles of Facial Expression: None, normal Lips and Perioral Area: None, normal Jaw: None, normal Tongue: None, normal,Extremity Movements Upper (arms, wrists, hands, fingers): None, normal Lower (legs, knees, ankles, toes): None, normal, Trunk Movements Neck, shoulders, hips: None, normal, Overall Severity Severity of abnormal movements (highest score from questions above): None, normal Incapacitation due to abnormal movements: None, normal Patient's awareness of abnormal movements (rate only patient's report): No Awareness, Dental Status Current problems with teeth and/or  dentures?: No Does patient usually wear dentures?: No  CIWA:  CIWA-Ar Total: 3 COWS:     Musculoskeletal: Strength & Muscle Tone: within normal limits Gait & Station: normal Patient leans: N/A  Psychiatric Specialty Exam: Physical Exam Vitals and nursing note reviewed.  Constitutional:      Appearance: She is obese.  HENT:     Head: Normocephalic and atraumatic.  Pulmonary:     Effort: Pulmonary effort is normal.  Neurological:     General: No focal deficit present.     Mental Status: She is alert and oriented to person, place, and time.     Review  of Systems  Blood pressure 125/72, pulse (!) 111, temperature 97.9 F (36.6 C), resp. rate 18, height 5' 5.5" (1.664 m), weight 130.2 kg, SpO2 98 %.Body mass index is 47.03 kg/m.  General Appearance: Disheveled  Eye Contact:  Fair  Speech:  Normal Rate  Volume:  Decreased  Mood:  Depressed and Dysphoric  Affect:  Congruent  Thought Process:  Coherent and Descriptions of Associations: Circumstantial  Orientation:  Full (Time, Place, and Person)  Thought Content:  Logical  Suicidal Thoughts:  No  Homicidal Thoughts:  No  Memory:  Immediate;   Fair Recent;   Fair Remote;   Fair  Judgement:  Poor  Insight:  Lacking  Psychomotor Activity:  Psychomotor Retardation  Concentration:  Concentration: Fair and Attention Span: Fair  Recall:  [employer] of Knowledge:  Fair  Language:  Good  Akathisia:  Negative  Handed:  Right  AIMS (if indicated):     Assets:  Desire for Improvement Resilience  ADL's:  Intact  Cognition:  WNL  Sleep:  Number of Hours: 6.75     Treatment Plan Summary: Daily contact with patient to assess and evaluate symptoms and progress in treatment, Medication management and Plan : Patient is seen and examined.  Patient is a 30 year old female with the above-stated past psychiatric history who is seen in follow-up.  Diagnosis: 1. Major depression, recurrent, severe with psychotic features 2.  Posttraumatic stress disorder. 3. Probable borderline personality disorder. 4. Unspecified tachycardia (with EKGs here showing a sinus tachycardia). 5. History of hypertension. 6. History of hepatic steatosis with mildly elevated liver function enzymes. 7. Probable diabetes    Plan: 1.  Continue with prozac 10mg  qam, zyprexa 2.5mg  qhs   2.  Continue on supplemental iron now that we can see the blood loss from her self-inflicted injuries.  This is for her anemia. 3.  Continue Toprol-XL 100 mg p.o. daily for hypertension and tachycardia. 4.  Continue Neosporin ointment to the self-inflicted wound. 5.  Continue Zyprexa Zydis 5 mg p.o. or Zyprexa 5 mg IM every 6 hours as needed agitation. 6.  Continue Protonix 40 mg p.o. daily for gastric protection. 7.  Decrease melatonin to 3 mg p.o. nightly as needed insomnia. 8.  D/c trazodone 9.  Order T3 and T4 WNL. 10. CIPRO HC ordered BID for right ear infection x 7 days 11.  Disposition planning-continue with request to have patient transferred to [facility] hospital for higher level of care. Will also continue to seek out ECT placement. 12.  Length of stay-unknown  Dixie Dials, MD 09/19/2020, 2:18 PM

## 2020-09-19 NOTE — Progress Notes (Signed)
Closed observation: Patient on closed observation for safety.  Patient is calm and appropriate.  Medication given as prescribed.  Routine safety checks maintained.  Patient interacting with staff.  Denies suicidal thoughts, auditory and visual hallucinations.  Patient is safe on the unit.

## 2020-09-19 NOTE — Progress Notes (Signed)
Recreation Therapy Notes  Date: 2.17.22 Time: 0945 Location: [address] Dayroom   Group Topic: Self Esteem    Goal Area(s) Addresses:  Patient will appropriately identify what self esteem is.  Patient will create a shield of armor describing themselves.  Patient will successfully identify positive attributes about themselves.  Patient will acknowledge benefit of improved self-esteem.    Behavioral Response: Engaged  Intervention / Activity: Self-Esteem Shield.  LRT gave each patient a blank shield divided into four quadrants.Patient was asked to fill in the shield to show off their unique attributes, four quadrants reflected the following:  The Upper Left quadrant- two things or people you value The Upper Right quadrant- two lessons you've learned in life thus far The Lower Left quadrant- three things that make you unique The Lower Right quadrant- one goal you would like to work towards    Patients were provided sheets with the shield printed on them and markers.  Education: Self esteem, Communication, Positive self-talk, Discharge Planning   Education Outcome: Acknowledges education/Verbalizes understanding of Education/In group clarification offered/Needs further education   Comments: Pt stated the weather and environment influence self esteem.  Pt was bright and active during group.  In the first quadrant, pt stated she valued her mother, sister and aunt; in the second quadrant pt identified lessons as nothing worth having is achieved easily and only you can change the outcome of your story; in quadrant three pt identified artistic soul, being analytical and eyes changing color as things that make her unique and in quadrant four pt stated she wants to finish college.  Pt also shared with the group, the semicolon represented suicide awareness.    Victorino Sparrow, LRT/CTRS    Victorino Sparrow A 09/19/2020 10:52 AM

## 2020-09-19 NOTE — Progress Notes (Signed)
Patient ID: Mackenzie Whitaker, female   DOB: [birth year], 30 y.o.   MRN: [id number]   After discussion with Nursing and Mental health staff, it was determined that patient had a good day without any signs of self injurious behavior.   Patient today denies any SI or desire to engage in Self-injurious behavior.  Her 1:1 status will be downgraded to "close observation" for a trial period this afternoon and this evening and further decisions about her observation status will be made tomorrow.

## 2020-09-19 NOTE — Progress Notes (Signed)
Pt continues on close observation due to increased suicide risk and self-harm behaviors during this admission. Pt is in a pleasant mood tonight. She has been smiling and laughing. She did participate in group tonight. Pt reports feeling "happier" today. She rates her anxiety a 4 on a scale of 0-10 (10 being the greatest) tonight. She attributes her anxiety to a phone call she had earlier with a friend. Her friend wants to get rid of her belongings and pt would like for her Mom to obtain them, but she lives 8 hours away. She is worried about what will happen to her belongings. Pt has been in the dayroom watching television. She feels like the medication has been effective and denies any side effects. Sutures to bilateral AC areas are clean, dry, and intact. She denies SI/HI and AVH. She verbally contracts for safety. Agrees to notify staff immediately for any thoughts of hurting herself or anyone else. Active listening, reassurance, and support provided. Q 15 min safety checks continue. Pt's safety has been maintained.

## 2020-09-19 NOTE — Progress Notes (Signed)
Pt continues on closed observation due to increased suicide risk and self-harm behaviors during this admission. Pt is currently laying on her back with her eyes closed. She appears to be sleeping. Respirations are even and unlabored. No distress has been observed. Q 15 min safety checks continue. Pt's safety has been maintained.

## 2020-09-19 NOTE — Progress Notes (Signed)
1:1 Note: Patient maintained on constant supervision for safety.  Patient visible in milieu attending group and participating.  Patient presents with a calm affect and mood.  Routine safety checks maintained.  Patient is safe on the unit with supervision.

## 2020-09-19 NOTE — [facility] Group Notes (Signed)
Adult Psychoeducational Group Note  Date:  09/19/2020 Time:  9:40 PM  Group Topic/Focus:  Wrap-Up Group:   The focus of this group is to help patients review their daily goal of treatment and discuss progress on daily workbooks.  Participation Level:  Active  Participation Quality:  Attentive  Affect:  Appropriate  Cognitive:  Appropriate  Insight: Improving  Engagement in Group:  Engaged  Modes of Intervention:  Discussion  Additional Comments  Dalene Carrow 09/19/2020, 9:40 PM

## 2020-09-19 NOTE — [facility] Group Notes (Signed)
Occupational Therapy Group Note Date: 09/19/2020 Group Topic/Focus: Socialization/Social Skills  Group Description: Group encouraged increased participation and engagement through discussion focused on STRENGTHS. Patients were encouraged to fill out a worksheet to structure discussion, that included questions such as things I am good at, compliments I have received, what I like about my appearance, challenges I have overcome, I have helped others by, things that make me unique, what I value the most, and times I have made others happy. Discussion followed with patients sharing their responses and highlighting their own personal strengths.  Therapeutic Goals: Identify strengths vs weaknesses Discuss and identify ways we can highlight our strengths Participation Level: Active   Participation Quality: Independent   Behavior: Calm, Cooperative and Interactive   Speech/Thought Process: Focused   Affect/Mood: Euthymic   Insight: Fair   Judgement: Fair   Individualization: Mackenzie Whitaker was active and independent in her participation of discussion and activity, sharing that the activity was 'easy' for her and it was not difficult to reflect on her strengths. Pt identified strengths as art, photography, making decision to let kids up for adoption, and listening to others.   Modes of Intervention: Activity, Discussion, Education, Socialization and Support  Patient Response to Interventions:  Attentive, Engaged and Receptive   Plan: Continue to engage patient in OT groups 2 - 3x/week.  09/19/2020  Ponciano Ort, MOT, OTR/L

## 2020-09-19 NOTE — Progress Notes (Signed)
1:1 Note: Patient on constant supervision for safety.  Patient is calm and cooperative.  Medication given as prescribed.  Sutures to right arm intact.  No signs of infection noted.  Patient attended and participated.  Patient is safe on the unit with supervision.  Offered no complaints.

## 2020-09-20 MED ORDER — FLUOXETINE HCL 20 MG PO CAPS
20.0000 mg | ORAL_CAPSULE | Freq: Every day | ORAL | Status: DC
  Administered 2020-09-21 – 2020-09-24 (×4): 20 mg via ORAL
  Filled 2020-09-20 (×6): qty 1

## 2020-09-20 NOTE — Progress Notes (Signed)
Adult Psychoeducational Group Note  Date:  09/20/2020 Time:  10:48 PM  Group Topic/Focus:  Wrap-Up Group:   The focus of this group is to help patients review their daily goal of treatment and discuss progress on daily workbooks.  Participation Level:  Did Not Attend  Participation Quality:  Did Not Attend  Affect:  Did Not Attend  Cognitive:  Did Not Attend  Insight: None  Engagement in Group:  Did Not Attend  Modes of Intervention:  Did Not Attend  Additional Comments:  Pt did not attend wrap up group tonight.  [name] 09/20/2020, 10:48 PM

## 2020-09-20 NOTE — [facility] Counselor (Signed)
This patient remains on the [facility] waitlist.  ° ° ° °Carmeron Heady MSW, LCSW °Clincal Social Worker  °[facility]  °

## 2020-09-20 NOTE — Progress Notes (Signed)
   09/20/20 2030  Psych Admission Type (Psych Patients Only)  Admission Status Involuntary  Psychosocial Assessment  Patient Complaints Anxiety  Eye Contact Fair  Facial Expression Anxious  Affect Anxious  Speech Logical/coherent  Interaction Assertive  Motor Activity Slow  Appearance/Hygiene Disheveled;Poor hygiene  Behavior Characteristics Cooperative;Appropriate to situation;Anxious  Mood Anxious;Pleasant  Thought Process  Coherency Concrete thinking  Content WDL  Delusions None reported or observed  Perception WDL  Hallucination None reported or observed  Judgment Poor  Confusion None  Danger to Self  Current suicidal ideation? Denies  Danger to Others  Danger to Others None reported or observed   Pt denies SI, HI, AVH and pain. Pt taking ear drops for infection in R ear. Pt pleasant and anxious.

## 2020-09-20 NOTE — Progress Notes (Signed)
Recreation Therapy Notes  Date:  2.18.22 Time: 1005 Location: [address] Day Room  Group Topic: Stress Management  Goal Area(s) Addresses:  Patient will identify positive stress management techniques. Patient will identify benefits of using stress management post d/c.  Behavioral Response: Engaged  Intervention: Stress Management  Activity: Meditation.  LRT played a meditation focused on loving-kindness of self and others.  Patients were to listen and learn to speak positive affirmations over self and others.  Education: Stress Management, Discharge Planning.   Education Outcome: Acknowledges Education  Clinical Observations/Feedback: Pt was bright and attentive during group session.  Pt sat calmly and listened as meditation played.    Victorino Sparrow, LRT/CTRS    Victorino Sparrow A 09/20/2020 11:35 AM

## 2020-09-20 NOTE — Progress Notes (Signed)
°   09/19/20 2041  Psych Admission Type (Psych Patients Only)  Admission Status Involuntary  Psychosocial Assessment  Patient Complaints Anxiety;Worrying  Eye Contact Fair  Facial Expression Anxious;Other (Comment) (improved, pt has been smiling and laughing)  Affect Anxious  Speech Logical/coherent  Interaction Assertive  Motor Activity Other (Comment) (WNL)  Appearance/Hygiene Disheveled;Poor hygiene  Behavior Characteristics Cooperative;Anxious  Mood Anxious;Pleasant  Thought Process  Coherency Concrete thinking  Content WDL  Delusions None reported or observed  Perception WDL  Hallucination None reported or observed  Judgment Poor  Confusion None  Danger to Self  Current suicidal ideation? Denies  Self-Injurious Behavior No self-injurious ideation or behavior indicators observed or expressed   Agreement Not to Harm Self Yes  Description of Agreement verbally agrees to notify staff immediately for any thoughts of hurting herself or anyone else  Danger to Others  Danger to Others None reported or observed

## 2020-09-20 NOTE — Progress Notes (Signed)
Close obs note  Pt has been seen in the dayroom interacting with peers and animated. Pt seems happy with the possibility of coming off their close obs. Pt did complain of a headache that decreased from a 4 to a 2. Pt provided nourishment. Pt safe on in the unit. Will continue to monitor.

## 2020-09-20 NOTE — Progress Notes (Signed)
Pt continues on close observation due to increased suicide risk and self-harm behaviors during this admission. Pt is currently resting in bed with her eyes closed. No self harm behaviors or anger outburts have been observed so far. She appears to be sleeping. Respirations are even and unlabored. No distress has been observed. Q 15 min safety checks continue. Pt's safety has been maintained.

## 2020-09-20 NOTE — Progress Notes (Signed)
Close obs note  Pt found resting; allowed to sleep. Upon awakening, pt was pleasant and compliant with medications. Pt denies any physical complaints or pain. Pt was animated with assessment. Pt denies current si/hi/ah/vh and verbally agrees to approach staff if these become apparent or before harming self/others while at [place]. Close obs continues for safety. Pt safe on the unit. q61m safety checks implemented and continued. Will continue to monitor.

## 2020-09-20 NOTE — Progress Notes (Addendum)
Patient ID: Mackenzie Whitaker, female   DOB: 1990/11/23, 30 y.o.   MRN: [id number]   [facility] MD Progress Note  09/20/2020 1:20 PM Mackenzie Whitaker  MRN:  [id number]  INFO:  Patient is a 30 year old female with a past psychiatric history significant for depression, posttraumatic stress disorder, suspected borderline personality disorder who was admitted on 09/13/2020 after a self-inflicted injury for suicide attempt.  DAILY NOTE:   Patient seen, chart reviewed and case discussed with treatment team.  Patient continues to have labile mood with fluctuations between anxious depressed with SI as well as periods of brightness where she is interacting well with others. Patient still endorses depressed mood but states that her SI has decreased since being here and switching medications.   She is eating and sleeping okay. Attends some groups but isolates in her room as well.   Denies any adverse effects of the Prozac or Zyprexa medication.  Today patient reported ear pain is decreasing.     Principal Problem: <principal problem not specified> Diagnosis: Active Problems:   Severe recurrent major depression without psychotic features (HCC)   Recurrent major depression-severe (HCC)  Total Time spent with patient: 20 minutes  Past Psychiatric History: See admission H&P  Past Medical History:  Past Medical History:  Diagnosis Date  . Diabetes mellitus ([place])   . Fatty liver disease, nonalcoholic   . GAD (generalized anxiety disorder)   . History of cardioversion   . HTN (hypertension)   . MDD (major depressive disorder)   . PTSD (post-traumatic stress disorder)   . Sinus tachycardia     Past Surgical History:  Procedure Laterality Date  . APPENDECTOMY    . SKIN CANCER EXCISION     Family History: History reviewed. No pertinent family history. Family Psychiatric  History: See admission H&P Social History:  Social History   Substance and Sexual Activity  Alcohol Use Not Currently      Social History   Substance and Sexual Activity  Drug Use Not Currently    Social History   Socioeconomic History  . Marital status: Single    Spouse name: Not on file  . Number of children: Not on file  . Years of education: Not on file  . Highest education level: Not on file  Occupational History  . Not on file  Tobacco Use  . Smoking status: Never Smoker  . Smokeless tobacco: Never Used  Vaping Use  . Vaping Use: Never used  Substance and Sexual Activity  . Alcohol use: Not Currently  . Drug use: Not Currently  . Sexual activity: Not Currently  Other Topics Concern  . Not on file  Social History Narrative  . Not on file   Social Determinants of Health   Financial Resource Strain: Not on file  Food Insecurity: Not on file  Transportation Needs: Not on file  Physical Activity: Not on file  Stress: Not on file  Social Connections: Not on file   Additional Social History:                         Sleep: Fair  Appetite:  Good  Current Medications: Current Facility-Administered Medications  Medication Dose Route Frequency Provider Last Rate Last Admin  . acetaminophen (TYLENOL) tablet 650 mg  650 mg Oral Q6H PRN Lindon Romp A, NP      . alum & mag hydroxide-simeth (MAALOX/MYLANTA) 200-200-20 MG/5ML suspension 30 mL  30 mL Oral Q4H PRN Rozetta Nunnery, NP      .  ciprofloxacin-hydrocortisone (CIPRO HC OTIC) 0.2-1 % OTIC (EAR) suspension 3 drop  3 drop Right EAR BID Lirio Bach, Dorene Ar, MD   3 drop at 09/19/20 2041  . ferrous sulfate tablet 325 mg  325 mg Oral BID WC Sharma Covert, MD   325 mg at 09/20/20 0955  . [START ON 09/21/2020] FLUoxetine (PROZAC) capsule 20 mg  20 mg Oral Daily Ourania Hamler A, MD      . hydrOXYzine (ATARAX/VISTARIL) tablet 25 mg  25 mg Oral TID PRN Rozetta Nunnery, NP   25 mg at 09/19/20 2041  . LORazepam (ATIVAN) tablet 1 mg  1 mg Oral Q6H PRN Sharma Covert, MD   1 mg at 09/20/20 0956  . magnesium hydroxide (MILK OF  MAGNESIA) suspension 30 mL  30 mL Oral Daily PRN Lindon Romp A, NP      . melatonin tablet 3 mg  3 mg Oral QHS PRN Sharma Covert, MD   3 mg at 09/19/20 2041  . metoprolol succinate (TOPROL-XL) 24 hr tablet 100 mg  100 mg Oral Daily Sharma Covert, MD   100 mg at 09/20/20 0955  . neomycin-bacitracin-polymyxin (NEOSPORIN) ointment packet   Topical BID Sharma Covert, MD   Given at 09/20/20 [id number]  . OLANZapine (ZYPREXA) tablet 2.5 mg  2.5 mg Oral QHS Darien Mignogna, Dorene Ar, MD   2.5 mg at 09/19/20 2041  . pantoprazole (PROTONIX) EC tablet 40 mg  40 mg Oral Daily Sharma Covert, MD   40 mg at 09/20/20 4970    Lab Results:  No results found for this or any previous visit (from the past 48 hour(s)).  Blood Alcohol level:  Lab Results  Component Value Date   ETH <10 26/37/8588    Metabolic Disorder Labs: Lab Results  Component Value Date   HGBA1C 5.0 09/16/2020   MPG 96.8 09/16/2020   MPG 99.67 09/14/2020   No results found for: PROLACTIN Lab Results  Component Value Date   CHOL 64 09/16/2020   TRIG 41 09/16/2020   HDL 27 (L) 09/16/2020   CHOLHDL 2.4 09/16/2020   VLDL 8 09/16/2020   LDLCALC 29 09/16/2020   LDLCALC 33 09/14/2020    Physical Findings: AIMS: Facial and Oral Movements Muscles of Facial Expression: None, normal Lips and Perioral Area: None, normal Jaw: None, normal Tongue: None, normal,Extremity Movements Upper (arms, wrists, hands, fingers): None, normal Lower (legs, knees, ankles, toes): None, normal, Trunk Movements Neck, shoulders, hips: None, normal, Overall Severity Severity of abnormal movements (highest score from questions above): None, normal Incapacitation due to abnormal movements: None, normal Patient's awareness of abnormal movements (rate only patient's report): No Awareness, Dental Status Current problems with teeth and/or dentures?: No Does patient usually wear dentures?: No  CIWA:  CIWA-Ar Total: 3 COWS:      Musculoskeletal: Strength & Muscle Tone: within normal limits Gait & Station: normal Patient leans: N/A  Psychiatric Specialty Exam: Physical Exam Vitals and nursing note reviewed.  Constitutional:      Appearance: She is obese.  HENT:     Head: Normocephalic and atraumatic.  Pulmonary:     Effort: Pulmonary effort is normal.  Neurological:     General: No focal deficit present.     Mental Status: She is alert and oriented to person, place, and time.     Review of Systems  Blood pressure 111/63, pulse 83, temperature (!) 97.5 F (36.4 C), temperature source Oral, resp. rate 18, height 5' 5.5" (1.664 m), weight  130.2 kg, SpO2 95 %.Body mass index is 47.03 kg/m.  General Appearance: Disheveled  Eye Contact:  Fair  Speech:  Normal Rate  Volume:  Decreased  Mood:  Depressed and Dysphoric  Affect:  Congruent  Thought Process:  Coherent and Descriptions of Associations: Circumstantial  Orientation:  Full (Time, Place, and Person)  Thought Content:  Logical  Suicidal Thoughts:  intermittengly  Homicidal Thoughts:  No  Memory:  Immediate;   Fair Recent;   Fair Remote;   Fair  Judgement:  Poor  Insight:  Lacking  Psychomotor Activity:  Psychomotor Retardation  Concentration:  Concentration: Fair and Attention Span: Fair  Recall:  [employer] of Knowledge:  Fair  Language:  Good  Akathisia:  Negative  Handed:  Right  AIMS (if indicated):     Assets:  Desire for Improvement Resilience  ADL's:  Intact  Cognition:  WNL  Sleep:  Number of Hours: 6.25     Treatment Plan Summary: Daily contact with patient to assess and evaluate symptoms and progress in treatment, Medication management and Plan : Patient is seen and examined.  Patient is a 30 year old female with the above-stated past psychiatric history who is seen in follow-up.  Diagnosis: 1. Major depression, recurrent, severe with psychotic features 2. Posttraumatic stress disorder. 3. Probable borderline  personality disorder. 4. Unspecified tachycardia (with EKGs here showing a sinus tachycardia). 5. History of hypertension. 6. History of hepatic steatosis with mildly elevated liver function enzymes. 7. Probable diabetes    Plan: 1.  Continue with zyprexa 2.5mg  qhs  Increase Prozac to 20mg  po qam 2.  Continue on supplemental iron now that we can see the blood loss from her self-inflicted injuries.  This is for her anemia. 3.  Continue Toprol-XL 100 mg p.o. daily for hypertension and tachycardia. 4.  Continue Neosporin ointment to the self-inflicted wound. 5.  Continue Zyprexa Zydis 5 mg p.o. or Zyprexa 5 mg IM every 6 hours as needed agitation. 6.  Continue Protonix 40 mg p.o. daily for gastric protection. 7.  Melatonin 3 mg p.o. nightly as needed insomnia. 8.  Order T3 and T4 WNL. 9. CIPRO HC ordered BID for right ear infection x 7 days 10.  Disposition planning-continue with request to have patient transferred to [facility] hospital for higher level of care. Will also continue to seek out ECT placement. 11.  Length of stay-unknown  Dixie Dials, MD 09/20/2020, 1:20 PM

## 2020-09-20 NOTE — Plan of Care (Signed)
  Problem: Self-Concept: Goal: Ability to disclose and discuss suicidal ideas will improve Outcome: Progressing Goal: Will verbalize positive feelings about self Outcome: Progressing   Problem: Education: Goal: Utilization of techniques to improve thought processes will improve Outcome: Progressing Goal: Knowledge of the prescribed therapeutic regimen will improve Outcome: Progressing

## 2020-09-20 NOTE — Telephone Encounter (Signed)
Called patient to discuss her new patient appointment fees. Patient did not answer and her voice mail is full.

## 2020-09-20 NOTE — Progress Notes (Signed)
Pt continues on closeobservation due to increased suicide risk. Pt is currently resting in bed with her eyes closed. She appears to be sleeping. Respirations are even and unlabored. No distress has been observed. Q 15 min safety checks continue. Pt's safety has been maintained.

## 2020-09-20 NOTE — Progress Notes (Signed)
Close obs note  Pt has been seen in the dayroom interacting with sitter and peers. Pt is animated on observation. Pt has been seen in the group participating. Pt denies any physical complaints. Pt safe on the unit. q73m safety checks implemented and continued. Will continue to monitor.

## 2020-09-20 NOTE — Progress Notes (Signed)
Nursing Close Obs note D:Pt observed laying in bed. Pt states she is too excited about leaving next week to sleep and wants other meds for sleep because her mind is racing. RR even and unlabored.  A: Pt given PRN medication per MAR. 1:1 observation continues for safety  R: Pt remains safe

## 2020-09-21 NOTE — Progress Notes (Addendum)
Patient ID: Mackenzie Whitaker, female   DOB: 11/11/1990, 30 y.o.   MRN: [id number]   [facility] MD Progress Note  09/21/2020 1:14 PM Mackenzie Whitaker  MRN:  [id number]  INFO:  Mackenzie Whitaker is a 30 year old female with a past psychiatric history significant for depression, posttraumatic stress disorder, suspected borderline personality disorder who was admitted on 09/13/2020 after a self-inflicted injury for suicide attempt.  DAILY NOTE:   Patient seen face to face, sitting in the dayroom, , chart reviewed and case discussed with treatment team.  Patient continues to have labile mood with fluctuations between anxious depressed with SI as well as periods of brightness where she is interacting well with others.Today, patient is smiling, makes good eye contact and is pleasant upon approach. She is trying to attend more groups and is in the dayroom watching TV. She remains on close observation for unpredictable lability of her mood. She has been calm and cooperative. She stated she will live with her mother when discharged, she feels safe there. She had been living with a friend who she stated "has issues with depression so it is not a good place for me to live." Patient stated she feels safe on the unit and contracts for safety. She has stitches to her bilateral antecubitals and is requesting to have them taken out. She is complaining of the stitches poking into her. She is requesting to be discharged on Tuesday. She is taking her medications with out issues. Patient still endorses depressed mood but states she has no SI or plan today. She described her mood as "excited" because she is going to [place] with her mother to move her 31 year old sister back to [place]. She denies anxiety and depression and stated she is sleeping and eating well. She slept 6.5 hours. No medication changes today. Encouragement and support offered.   Principal Problem: <principal problem not specified> Diagnosis: Active Problems:   Severe recurrent  major depression without psychotic features (HCC)   Recurrent major depression-severe (HCC)  Total Time spent with patient: 20 minutes  Past Psychiatric History: See admission H&P  Past Medical History:  Past Medical History:  Diagnosis Date  . Diabetes mellitus ([place])   . Fatty liver disease, nonalcoholic   . GAD (generalized anxiety disorder)   . History of cardioversion   . HTN (hypertension)   . MDD (major depressive disorder)   . PTSD (post-traumatic stress disorder)   . Sinus tachycardia     Past Surgical History:  Procedure Laterality Date  . APPENDECTOMY    . SKIN CANCER EXCISION     Family History: History reviewed. No pertinent family history. Family Psychiatric  History: See admission H&P Social History:  Social History   Substance and Sexual Activity  Alcohol Use Not Currently     Social History   Substance and Sexual Activity  Drug Use Not Currently    Social History   Socioeconomic History  . Marital status: Single    Spouse name: Not on file  . Number of children: Not on file  . Years of education: Not on file  . Highest education level: Not on file  Occupational History  . Not on file  Tobacco Use  . Smoking status: Never Smoker  . Smokeless tobacco: Never Used  Vaping Use  . Vaping Use: Never used  Substance and Sexual Activity  . Alcohol use: Not Currently  . Drug use: Not Currently  . Sexual activity: Not Currently  Other Topics Concern  . Not on  file  Social History Narrative  . Not on file   Social Determinants of Health   Financial Resource Strain: Not on file  Food Insecurity: Not on file  Transportation Needs: Not on file  Physical Activity: Not on file  Stress: Not on file  Social Connections: Not on file   Additional Social History:    Sleep: Good  Appetite:  Good  Current Medications: Current Facility-Administered Medications  Medication Dose Route Frequency Provider Last Rate Last Admin  . acetaminophen  (TYLENOL) tablet 650 mg  650 mg Oral Q6H PRN Lindon Romp A, NP   650 mg at 09/20/20 1628  . alum & mag hydroxide-simeth (MAALOX/MYLANTA) 200-200-20 MG/5ML suspension 30 mL  30 mL Oral Q4H PRN Lindon Romp A, NP      . ciprofloxacin-hydrocortisone (CIPRO HC OTIC) 0.2-1 % OTIC (EAR) suspension 3 drop  3 drop Right EAR BID Cristofano, Dorene Ar, MD   3 drop at 09/21/20 1138  . ferrous sulfate tablet 325 mg  325 mg Oral BID WC Sharma Covert, MD   325 mg at 09/21/20 1135  . FLUoxetine (PROZAC) capsule 20 mg  20 mg Oral Daily Cristofano, Paul A, MD   20 mg at 09/21/20 1134  . hydrOXYzine (ATARAX/VISTARIL) tablet 25 mg  25 mg Oral TID PRN Rozetta Nunnery, NP   25 mg at 09/20/20 2023  . LORazepam (ATIVAN) tablet 1 mg  1 mg Oral Q6H PRN Sharma Covert, MD   1 mg at 09/20/20 2213  . magnesium hydroxide (MILK OF MAGNESIA) suspension 30 mL  30 mL Oral Daily PRN Lindon Romp A, NP      . melatonin tablet 3 mg  3 mg Oral QHS PRN Sharma Covert, MD   3 mg at 09/20/20 2023  . metoprolol succinate (TOPROL-XL) 24 hr tablet 100 mg  100 mg Oral Daily Sharma Covert, MD   100 mg at 09/21/20 1134  . neomycin-bacitracin-polymyxin (NEOSPORIN) ointment packet   Topical BID Sharma Covert, MD   Given at 09/21/20 1137  . OLANZapine (ZYPREXA) tablet 2.5 mg  2.5 mg Oral QHS Cristofano, Paul A, MD   2.5 mg at 09/20/20 2023  . pantoprazole (PROTONIX) EC tablet 40 mg  40 mg Oral Daily Sharma Covert, MD   40 mg at 09/21/20 1134    Lab Results:  No results found for this or any previous visit (from the past 48 hour(s)).  Blood Alcohol level:  Lab Results  Component Value Date   ETH <10 27/01/2375    Metabolic Disorder Labs: Lab Results  Component Value Date   HGBA1C 5.0 09/16/2020   MPG 96.8 09/16/2020   MPG 99.67 09/14/2020   No results found for: PROLACTIN Lab Results  Component Value Date   CHOL 64 09/16/2020   TRIG 41 09/16/2020   HDL 27 (L) 09/16/2020   CHOLHDL 2.4 09/16/2020   VLDL 8  09/16/2020   LDLCALC 29 09/16/2020   LDLCALC 33 09/14/2020    Physical Findings: AIMS: Facial and Oral Movements Muscles of Facial Expression: None, normal Lips and Perioral Area: None, normal Jaw: None, normal Tongue: None, normal,Extremity Movements Upper (arms, wrists, hands, fingers): None, normal Lower (legs, knees, ankles, toes): None, normal, Trunk Movements Neck, shoulders, hips: None, normal, Overall Severity Severity of abnormal movements (highest score from questions above): None, normal Incapacitation due to abnormal movements: None, normal Patient's awareness of abnormal movements (rate only patient's report): No Awareness, Dental Status Current problems with teeth and/or  dentures?: No Does patient usually wear dentures?: No  CIWA:  CIWA-Ar Total: 3 COWS:     Musculoskeletal: Strength & Muscle Tone: within normal limits Gait & Station: normal Patient leans: N/A  Psychiatric Specialty Exam: Physical Exam Vitals and nursing note reviewed.  Constitutional:      Appearance: She is obese.  HENT:     Head: Normocephalic and atraumatic.  Pulmonary:     Effort: Pulmonary effort is normal.  Musculoskeletal:        General: Normal range of motion.     Cervical back: Normal range of motion.  Neurological:     General: No focal deficit present.     Mental Status: She is alert and oriented to person, place, and time.     Review of Systems  Constitutional: Negative.   HENT: Negative for ear pain, rhinorrhea, sneezing and sore throat.   Genitourinary: Negative.   Musculoskeletal: Negative.   Neurological: Negative.     Blood pressure 120/89, pulse 100, temperature 97.7 F (36.5 C), temperature source Oral, resp. rate 20, height 5' 5.5" (1.664 m), weight 130.2 kg, SpO2 100 %.Body mass index is 47.03 kg/m.  General Appearance: Casual, dressed in blue hospital gown, fair hygiene  Eye Contact:  Good  Speech:  Clear and Coherent and Normal Rate  Volume:  Normal   Mood:  Euthymic, pleasant on approach  Affect:  Congruent  Thought Process:  Coherent and Descriptions of Associations: Intact  Orientation:  Full (Time, Place, and Person)  Thought Content:  Logical  Suicidal Thoughts:  Intermittently, denies today  Homicidal Thoughts:  No, denies  Memory:  Immediate;   Fair Recent;   Fair Remote;   Fair  Judgement:  Poor  Insight:  Lacking  Psychomotor Activity:  Psychomotor Retardation  Concentration:  Concentration: Fair and Attention Span: Fair  Recall:  [employer] of Knowledge:  Fair  Language:  Good  Akathisia:  Negative  Handed:  Right  AIMS (if indicated):     Assets:  Desire for Improvement Housing Resilience Social Support  ADL's:  Intact  Cognition:  WNL  Sleep:  Number of Hours: 6.5     Treatment Plan Summary: Daily contact with patient to assess and evaluate symptoms and progress in treatment, Medication management and Plan : Patient is seen and examined.  Patient is a 30 year old female with the above-stated past psychiatric history who is seen in follow-up.  Diagnosis: 1. Major depression, recurrent, severe with psychotic features 2. Posttraumatic stress disorder. 3. Probable borderline personality disorder. 4. Unspecified tachycardia (with EKGs here showing a sinus tachycardia). 5. History of hypertension. 6. History of hepatic steatosis with mildly elevated liver function enzymes. 7. Probable diabetes    Plan:  1. Continue with zyprexa 2.5mg  qhs  Increase Prozac to 20mg  po qam 2. Continue on supplemental iron now that we can see the blood loss from her self-inflicted injuries.  This is for her anemia. 3. Continue Toprol-XL 100 mg p.o. daily for hypertension and tachycardia. 4. Continue Neosporin ointment to the self-inflicted wound. 5. Continue Zyprexa Zydis 5 mg p.o. or Zyprexa 5 mg IM every 6 hours as needed agitation. 6. Continue Protonix 40 mg p.o. daily for gastric protection. 7. Melatonin 3 mg p.o.  nightly as needed insomnia. 8. Order T3 and T4 WNL. 9. Continue CIPRO HC BID for right ear infection x 7 days 10. Disposition planning-continue with request to have patient transferred to [facility] hospital for higher level of care. Will also continue to seek  out ECT placement. 11. Length of stay-unknown   Ethelene Hal, NP 09/21/2020, 1:14 PM

## 2020-09-21 NOTE — Progress Notes (Signed)
Nursing Close Obs note D:Pt observed sleeping in bed with eyes closed. RR even and unlabored. No distress noted. A: Close observation continues for safety  R: Pt remains safe

## 2020-09-21 NOTE — Progress Notes (Signed)
   09/21/20 2115  COVID-19 Daily Checkoff  Have you had a fever (temp > 37.80C/100F)  in the past 24 hours?  No  If you have had runny nose, nasal congestion, sneezing in the past 24 hours, has it worsened? No  COVID-19 EXPOSURE  Have you traveled outside the state in the past 14 days? No  Have you been in contact with someone with a confirmed diagnosis of COVID-19 or PUI in the past 14 days without wearing appropriate PPE? No  Have you been living in the same home as a person with confirmed diagnosis of COVID-19 or a PUI (household contact)? No  Have you been diagnosed with COVID-19? No

## 2020-09-21 NOTE — [facility] Group Notes (Signed)
Psychoeducational Group Note  Date: 09-21-2020 Time: 0900-1000    Goal Setting   Purpose of Group: This group helps to provide patients with the steps of setting a goal that is specific, measurable, attainable, realistic and time specific. A discussion on how we keep ourselves stuck with negative self talk.    Participation Level:  Did not attend   Paulino Rily

## 2020-09-21 NOTE — Progress Notes (Signed)
Closed observation: Patient is in her room resting.  Patient is calm and appropriate.  Interacting with staff with a bright and pleasant affect.  Safety checks continues.  Patient is safe on the unit.

## 2020-09-21 NOTE — [facility] Group Notes (Signed)
LCSW Group Therapy Note  09/21/2020   10:00-11:00am   Type of Therapy and Topic:  Group Therapy: Anger Cues and Responses  Participation Level:  Did Not Attend   Description of Group:   In this group, patients learned how to recognize the physical, cognitive, emotional, and behavioral responses they have to anger-provoking situations.  They identified a recent time they became angry and how they reacted.  They analyzed how their reaction was possibly beneficial and how it was possibly unhelpful.  The group discussed a variety of healthier coping skills that could help with such a situation in the future.  Focus was placed on how helpful it is to recognize the underlying emotions to our anger, because working on those can lead to a more permanent solution as well as our ability to focus on the important rather than the urgent.  Therapeutic Goals: 1. Patients will remember their last incident of anger and how they felt emotionally and physically, what their thoughts were at the time, and how they behaved. 2. Patients will identify how their behavior at that time worked for them, as well as how it worked against them. 3. Patients will explore possible new behaviors to use in future anger situations. 4. Patients will learn that anger itself is normal and cannot be eliminated, and that healthier reactions can assist with resolving conflict rather than worsening situations.  Summary of Patient Progress:  The patient did not attend.  Therapeutic Modalities:   Cognitive Behavioral Therapy  Rolanda Jay

## 2020-09-21 NOTE — Progress Notes (Signed)
Close Observation Note- Patient is currently lying in bed awake with sitter in her room. She has been up in the dayroom watching tv, talked on the phone and was compliant with her medications. She reports having had a good day.

## 2020-09-21 NOTE — Progress Notes (Signed)
Closed observation: Patient presents with a calm affect and mood.  Patient in the dayroom interacting with staff and peers.  Voiced no concern.  Routine safety checks maintained.  Patient is safe on the unit with supervision.

## 2020-09-21 NOTE — Progress Notes (Signed)
Closed observation:  Patient is calm and appropriate on the unit.  Visible in milieu for group and activities.  Medication given as prescribed.   Denies suicidal thoughts, auditory and visual hallucinations.  Routine safety checks maintained.  Patient is safe on and off the unit for meals.

## 2020-09-22 NOTE — Progress Notes (Signed)
Pt rates her anxiety, depression and hopelessness all 0/10. Reports she slept well last night with good appetite, normal energy and good concentration level. Pt's goal for today is "getting off observation and going home". Observed in dayroom at long intervals during shift and was engaged in scheduled groups. Noted with a bright affect and pleasant mood. Interacts well with peers and staff. Denies SI, HI and AVH when assessed. Received PRN Tylenol 650 mg PO for headache 6/10. Reports relief when reassessed at 1800.  Emotional support, encouragement and reassurance provided to pt throughout this shift. Safety maintained on Close observation with assigned [facility] staff in attendance at all time. All medications given as ordered with verbal education and effects monitored.  Pt tolerates medications and meals well without discomfort. Pt remains safe on and off unit without self harm gesture thus far.

## 2020-09-22 NOTE — Progress Notes (Signed)
Patient ID: Mackenzie Whitaker, female   DOB: [birth year], 30 y.o.   MRN: [id number] 1:1 notes  09/22/2020 @ 2200  D: Patient sitting in dayroom with sitter on approach. Pt pleasant reports she had a good day, enjoyed groups and looking forward to discharge. Pt denies SI/HI/AVH and pain. No sign of distress noted at this time A: medications administered as prescribed. closed observation for pt safety continues. R: Patient is safe. Closed observation continues

## 2020-09-22 NOTE — Progress Notes (Signed)
Pt A & O X3. Presents with flat affect on initial approach but brightens up on interactions. Took her medications without issues. Denies SI, HI, AVH and pain at this time.  All medications given as ordered with verbal education and effects monitored. Close Observation maintained without self harm gestures or outburst. Support, encouragement and reassurance offered to pt. Attended scheduled groups when prompted. Remains safe on. Tolerated morning medications and breakfast well without discomfort.

## 2020-09-22 NOTE — [facility] Group Notes (Signed)
Adult Psychoeducational Group Not Date:  09/22/2020 Time:  2244-9753 Group Topic/Focus: PROGRESSIVE RELAXATION. A group where deep breathing is taught and tensing and relaxation muscle groups is used. Imagery is used as well.  Pts are asked to imagine 3 pillars that hold them up when they are not able to hold themselves up.  Participation Level:  Active  Participation Quality:  Appropriate  Affect:  Appropriate  Cognitive:  Oriented  Insight: Improving  Engagement in Group:  Engaged  Modes of Intervention:  Activity, Discussion, Education, and Support  Additional Comments:  Pt participated fuolly in the group. She rates her energy at a 10/10. States her mom, her biological sisters and aret are her pillars of strength.  Paulino Rily

## 2020-09-22 NOTE — Progress Notes (Signed)
Adult Psychoeducational Group Note  Date:  09/22/2020 Time:  11:41 PM  Group Topic/Focus:  Wrap-Up Group:   The focus of this group is to help patients review their daily goal of treatment and discuss progress on daily workbooks.  Participation Level:  Active  Participation Quality:  Appropriate  Affect:  Appropriate  Cognitive:  Appropriate  Insight: Appropriate  Engagement in Group:  Developing/Improving  Modes of Intervention:  Discussion  Additional Comments:  Pt stated her goal for today was to focus on her treatment plan. Pt stated she felt she accomplished her goal today. Pt stated she talk with her doctor and social worker, regarding her care today. Pt stated she took all her medication today from her providers. Pt stated been able to contact her family friend today improved her overall day. Pt stated her relationship with her family and support team has improved since she was admitted here. Pt rated her overall day a 10 today. Pt stated she felt better about herself today. Pt stated her appetite was pretty good today and she attend all meals today. Pt stated her sleep last night was good. Pt stated the goal for tonight was to get some rest. Pt stated she was in no physical pain. Pt deny auditory or visual hallucinations. Pt denies thoughts of harming herself or others. Pt stated she would alert staff if anything changes.  [name] 09/22/2020, 11:41 PM

## 2020-09-22 NOTE — [facility] Group Notes (Addendum)
[facility] LCSW Group Therapy Note  Date/Time:  09/22/2020  11:00AM-12:00PM  Type of Therapy and Topic:  Group Therapy:  Music and Mood  Participation Level:  Active   Description of Group: In this process group, members listened to a variety of genres of music and identified that different types of music evoke different responses.  Patients were encouraged to identify music that was soothing for them and music that was energizing for them.  Patients discussed how this knowledge can help with wellness and recovery in various ways including managing depression and anxiety as well as encouraging healthy sleep habits.    Therapeutic Goals: 1. Patients will explore the impact of different varieties of music on mood 2. Patients will verbalize the thoughts they have when listening to different types of music 3. Patients will identify music that is soothing to them as well as music that is energizing to them 4. Patients will discuss how to use this knowledge to assist in maintaining wellness and recovery 5. Patients will explore the use of music as a coping skill  Summary of Patient Progress:  At the beginning of group, patient expressed that music was a good coping.  At the end of group, patient expressed the same.    Therapeutic Modalities: Solution Focused Brief Therapy Activity   Elisabeth Pigeon, LCSW

## 2020-09-22 NOTE — Progress Notes (Signed)
Closed Observation - patient lying in bed asleep no distress noted, sitter at bedside for safety.

## 2020-09-22 NOTE — Progress Notes (Signed)
[facility] MD Progress Note 09/22/2020 11:52 AM Mackenzie Whitaker  MRN:  [id number]  Admission Information:  Mackenzie Whitaker is a 30 year old female with a past psychiatric history significant for depression, posttraumatic stress disorder, suspected borderline personality disorder who was admitted on 09/13/2020 after a self-inflicted injury for suicide attempt. Today's note:  Patient seen face to face in her room awake and on 1:1 observation.  She is friendly and medication compliance.  Patient was seen in group this morning.  She asked to be taken off close observation but was informed she needed this for her safety.  Patient report improved sleep and very good appetite.  Patient is planning to move to [place] to join her mother and she plans to drive herself alone in the car with her dog.  We discussed safety at this time and she was advised that driving long distance without family member or friend in the car with her will not be the best decision for her to make.  She reported she was in a bad mood when she came to the hospital and now with therapy and medications she is improving.  She reports learning coping skills and how to manage her stress much better now.  She denies feeling suicidal or having suicidal thoughts and no AVH.  Patient will remain on close observation for now.  Principal Problem: <principal problem not specified> Diagnosis: Active Problems:   Severe recurrent major depression without psychotic features (HCC)   Recurrent major depression-severe (HCC)  Total Time spent with patient: 20 minutes  Past Psychiatric History: See admission H&P  Past Medical History:  Past Medical History:  Diagnosis Date  . Diabetes mellitus ([place])   . Fatty liver disease, nonalcoholic   . GAD (generalized anxiety disorder)   . History of cardioversion   . HTN (hypertension)   . MDD (major depressive disorder)   . PTSD (post-traumatic stress disorder)   . Sinus tachycardia     Past Surgical  History:  Procedure Laterality Date  . APPENDECTOMY    . SKIN CANCER EXCISION     Family History: History reviewed. No pertinent family history. Family Psychiatric  History: See admission H&P Social History:  Social History   Substance and Sexual Activity  Alcohol Use Not Currently     Social History   Substance and Sexual Activity  Drug Use Not Currently    Social History   Socioeconomic History  . Marital status: Single    Spouse name: Not on file  . Number of children: Not on file  . Years of education: Not on file  . Highest education level: Not on file  Occupational History  . Not on file  Tobacco Use  . Smoking status: Never Smoker  . Smokeless tobacco: Never Used  Vaping Use  . Vaping Use: Never used  Substance and Sexual Activity  . Alcohol use: Not Currently  . Drug use: Not Currently  . Sexual activity: Not Currently  Other Topics Concern  . Not on file  Social History Narrative  . Not on file   Social Determinants of Health   Financial Resource Strain: Not on file  Food Insecurity: Not on file  Transportation Needs: Not on file  Physical Activity: Not on file  Stress: Not on file  Social Connections: Not on file   Additional Social History:    Sleep: Good  Appetite:  Good  Current Medications: Current Facility-Administered Medications  Medication Dose Route Frequency Provider Last Rate Last Admin  . acetaminophen (TYLENOL)  tablet 650 mg  650 mg Oral Q6H PRN Lindon Romp A, NP   650 mg at 09/20/20 1628  . alum & mag hydroxide-simeth (MAALOX/MYLANTA) 200-200-20 MG/5ML suspension 30 mL  30 mL Oral Q4H PRN Lindon Romp A, NP      . ciprofloxacin-hydrocortisone (CIPRO HC OTIC) 0.2-1 % OTIC (EAR) suspension 3 drop  3 drop Right EAR BID Cristofano, Dorene Ar, MD   3 drop at 09/22/20 1001  . ferrous sulfate tablet 325 mg  325 mg Oral BID WC Sharma Covert, MD   325 mg at 09/22/20 1829  . FLUoxetine (PROZAC) capsule 20 mg  20 mg Oral Daily  Cristofano, Dorene Ar, MD   20 mg at 09/22/20 1000  . hydrOXYzine (ATARAX/VISTARIL) tablet 25 mg  25 mg Oral TID PRN Lindon Romp A, NP   25 mg at 09/20/20 2023  . LORazepam (ATIVAN) tablet 1 mg  1 mg Oral Q6H PRN Sharma Covert, MD   1 mg at 09/21/20 2115  . magnesium hydroxide (MILK OF MAGNESIA) suspension 30 mL  30 mL Oral Daily PRN Lindon Romp A, NP      . melatonin tablet 3 mg  3 mg Oral QHS PRN Sharma Covert, MD   3 mg at 09/21/20 2115  . metoprolol succinate (TOPROL-XL) 24 hr tablet 100 mg  100 mg Oral Daily Sharma Covert, MD   100 mg at 09/22/20 1006  . neomycin-bacitracin-polymyxin (NEOSPORIN) ointment packet   Topical BID Sharma Covert, MD   1 application at 93/71/69 [phone]  . OLANZapine (ZYPREXA) tablet 2.5 mg  2.5 mg Oral QHS Cristofano, Dorene Ar, MD   2.5 mg at 09/21/20 2115  . pantoprazole (PROTONIX) EC tablet 40 mg  40 mg Oral Daily Sharma Covert, MD   40 mg at 09/22/20 3810    Lab Results:  No results found for this or any previous visit (from the past 48 hour(s)).  Blood Alcohol level:  Lab Results  Component Value Date   ETH <10 17/51/0258    Metabolic Disorder Labs: Lab Results  Component Value Date   HGBA1C 5.0 09/16/2020   MPG 96.8 09/16/2020   MPG 99.67 09/14/2020   No results found for: PROLACTIN Lab Results  Component Value Date   CHOL 64 09/16/2020   TRIG 41 09/16/2020   HDL 27 (L) 09/16/2020   CHOLHDL 2.4 09/16/2020   VLDL 8 09/16/2020   LDLCALC 29 09/16/2020   LDLCALC 33 09/14/2020    Physical Findings: AIMS: Facial and Oral Movements Muscles of Facial Expression: None, normal Lips and Perioral Area: None, normal Jaw: None, normal Tongue: None, normal,Extremity Movements Upper (arms, wrists, hands, fingers): None, normal Lower (legs, knees, ankles, toes): None, normal, Trunk Movements Neck, shoulders, hips: None, normal, Overall Severity Severity of abnormal movements (highest score from questions above): None,  normal Incapacitation due to abnormal movements: None, normal Patient's awareness of abnormal movements (rate only patient's report): No Awareness, Dental Status Current problems with teeth and/or dentures?: No Does patient usually wear dentures?: No  CIWA:  CIWA-Ar Total: 3 COWS:     Musculoskeletal: Strength & Muscle Tone: within normal limits Gait & Station: normal Patient leans: N/A  Psychiatric Specialty Exam: Physical Exam Vitals and nursing note reviewed.  Constitutional:      Appearance: She is obese.  HENT:     Head: Normocephalic and atraumatic.  Pulmonary:     Effort: Pulmonary effort is normal.  Musculoskeletal:  General: Normal range of motion.     Cervical back: Normal range of motion.  Neurological:     General: No focal deficit present.     Mental Status: She is alert and oriented to person, place, and time.     Review of Systems  Constitutional: Negative.   HENT: Negative for ear pain, rhinorrhea, sneezing and sore throat.   Genitourinary: Negative.   Musculoskeletal: Negative.   Neurological: Negative.     Blood pressure 125/75, pulse 98, temperature 97.9 F (36.6 C), temperature source Oral, resp. rate 20, height 5' 5.5" (1.664 m), weight 130.2 kg, SpO2 100 %.Body mass index is 47.03 kg/m.  General Appearance: Casual, dressed in blue hospital gown, fair hygiene  Eye Contact:  Good  Speech:  Clear and Coherent and Normal Rate  Volume:  Normal  Mood:  Euthymic, pleasant on approach  Affect:  Congruent  Thought Process:  Coherent and Descriptions of Associations: Intact  Orientation:  Full (Time, Place, and Person)  Thought Content:  Logical  Suicidal Thoughts:  Intermittently, denies today  Homicidal Thoughts:  No, denies  Memory:  Immediate;   Fair Recent;   Fair Remote;   Fair  Judgement:  Poor  Insight:  Lacking  Psychomotor Activity:  Psychomotor Retardation  Concentration:  Concentration: Fair and Attention Span: Fair  Recall:   [employer] of Knowledge:  Fair  Language:  Good  Akathisia:  Negative  Handed:  Right  AIMS (if indicated):     Assets:  Desire for Improvement Housing Resilience Social Support  ADL's:  Intact  Cognition:  WNL  Sleep:  Number of Hours: 7.5     Treatment Plan Summary: Daily contact with patient to assess and evaluate symptoms and progress in treatment, Medication management and Plan : Patient is seen and examined.  Patient is a 30 year old female with the above-stated past psychiatric history who is seen in follow-up.  Diagnosis: 1. Major depression, recurrent, severe with psychotic features 2. Posttraumatic stress disorder. 3. Probable borderline personality disorder. 4. Unspecified tachycardia (with EKGs here showing a sinus tachycardia). 5. History of hypertension. 6. History of hepatic steatosis with mildly elevated liver function enzymes. 7. Probable diabetes Plan:  1. Continue with zyprexa 2.5mg  qhs   Continue  Prozac to 20mg  po qam 2. Continue on supplemental iron now that we can see the blood loss from her self-inflicted injuries.  This is for her anemia. 3. Continue Toprol-XL 100 mg p.o. daily for hypertension and tachycardia. 4. Continue Neosporin ointment to the self-inflicted wound. 5. Continue Zyprexa Zydis 5 mg p.o. or Zyprexa 5 mg IM every 6 hours as needed agitation. 6. Continue Protonix 40 mg p.o. daily for gastric protection. 7. Melatonin 3 mg p.o. nightly as needed insomnia. 8. Order T3 and T4 WNL. 9. Continue CIPRO HC BID for right ear infection x 7 days 10. Disposition planning-continue with request to have patient transferred to [facility] hospital for higher level of care. Will also continue to seek out ECT placement. 11. Length of [place], NP-PMHNP-BC 09/22/2020, 11:52 AM

## 2020-09-22 NOTE — Progress Notes (Signed)
Pt awake in dayroom listening to music with peers and staff. Calm with bright affect. Interacts well with others. Attended noon group as scheduled. Close observation maintained as ordered without incident. Continued support, encouragement and safety offered to pt. Pt safe in milieu without self harm gestures.

## 2020-09-22 NOTE — Progress Notes (Addendum)
Closed Observation - Patient lying in bed asleep no distress. Sitter at bedside.

## 2020-09-23 NOTE — BHH Counselor (Signed)
CSW spoke with this patients mother Erroll Luna ([phone]) who reported that this patient is able to return to stay with her in [place] at discharge. Patients mother stated that this patient has a truck and feels comfortable with her driving to her house on her own.   Patients mother denied having any safety concerns for this patient discharging within the next few days.    Darletta Moll MSW, LCSW Clincal Social Worker  [facility]

## 2020-09-23 NOTE — BHH Counselor (Signed)
CSW left HIPAA compliant voicemail for this patients mother, Erroll Luna [phone] in regards to discharge plans for this patient.   Darletta Moll MSW, LCSW Clincal Social Worker  [facility]

## 2020-09-23 NOTE — Progress Notes (Signed)
[facility] 1:1 Observation Documentation   For the first (8) hours following discontinuation of 1:1 precautions, a progress note entry by nursing staff should be documented at least every 2 hours, reflecting the patient's behavior, condition, mood, and conversation.  Use the progress notes for additional entries.  Time 1:1 discontinued:  1300  Patient's Behavior: Patient is calm and pleasant.   Patient's Condition:  Patient in her room resting with no acute distress.    Patient's Conversation:  Patient is appropriate on the unit.  Vonzella Nipple A 09/23/2020, 9:00 PM

## 2020-09-23 NOTE — Progress Notes (Signed)
[facility] 1:1 Observation Documentation  For the first (8) hours following discontinuation of 1:1 precautions, a progress note entry by nursing staff should be documented at least every 2 hours, reflecting the patient's behavior, condition, mood, and conversation.  Use the progress notes for additional entries.  Time 1:1 discontinued:  1300  Patient's Behavior: Patient is calm and pleasant.   Patient's Condition:  Patient in her room resting with no acute distress.    Patient's Conversation:  Patient is appropriate on the unit.  Coralyn Mark Jakirah Zaun 09/23/2020, 7:01 PM

## 2020-09-23 NOTE — Progress Notes (Signed)
Patient ID: Mackenzie Whitaker, female   DOB: 1991-05-08, 30 y.o.   MRN: [id number] 1:1 notes  09/23/2020 @ 0200  D: Patient in bed sleeping. Respiration regular and unlabored. No sign of distress noted at this time A: 15 minutes checks continue for safety R: Patient remains safe.

## 2020-09-23 NOTE — Progress Notes (Signed)
Recreation Therapy Notes  Date: 2.21.22 Time: 1000 Location: 500 Hall Dayroom  Group Topic: Communication  Goal Area(s) Addresses:  Patient will effectively communicate with peers in group.  Patient will verbalize benefit of healthy communication. Patient will verbalize positive effect of healthy communication on post d/c goals.  Patient will identify communication techniques that made activity effective for group.   Behavioral Response: Engaged  Intervention: Blank paper, Geometrical shape pictures, Pencils   Activity: Geometrical Drawings.  Three volunteers will come to the front of the room and describe a picture that was given to them in as much detail as possible to the rest of the group.  The remaining group members will draw the picture how it is described to them.  Patients can not ask any detailed questions of the presenter, they can only ask the presenter to repeat themselves.  When each drawing is complete the presenter will show the group what the original picture looks like so the group can see how close they were to the original.  Education: Communication, Discharge Planning  Education Outcome: Acknowledges understanding/In group clarification offered/Needs additional education.   Clinical Observations/Feedback: Pt expressed the main elements of communication are talking and listening.  Pt also expressed we communicate through pen/paper and e-mail.  Pt was very descriptive when describing picture to the group.  Pt later stated that body language can affect how others interpret what you say.  Pt was bright, smiling, engaging and made sure each person was caught up with her as she described the picture during group session.     Victorino Sparrow, LRT/CTRS    Ria Comment, Kasin Tonkinson A 09/23/2020 11:22 AM

## 2020-09-23 NOTE — Tx Team (Signed)
Interdisciplinary Treatment and Diagnostic Plan Update  09/23/2020 Time of Session: 9:20am  Mackenzie Whitaker MRN: [id number]  Principal Diagnosis: <principal problem not specified>  Secondary Diagnoses: Active Problems:   Severe recurrent major depression without psychotic features ([place])   Recurrent major depression-severe (HCC)   Current Medications:  Current Facility-Administered Medications  Medication Dose Route Frequency Provider Last Rate Last Admin   acetaminophen (TYLENOL) tablet 650 mg  650 mg Oral Q6H PRN Rozetta Nunnery, NP   650 mg at 09/22/20 1707   alum & mag hydroxide-simeth (MAALOX/MYLANTA) 200-200-20 MG/5ML suspension 30 mL  30 mL Oral Q4H PRN Rozetta Nunnery, NP       ciprofloxacin-hydrocortisone (CIPRO HC OTIC) 0.2-1 % OTIC (EAR) suspension 3 drop  3 drop Right EAR BID Cristofano, Paul A, MD   3 drop at 09/23/20 1009   ferrous sulfate tablet 325 mg  325 mg Oral BID WC Sharma Covert, MD   325 mg at 09/23/20 1010   FLUoxetine (PROZAC) capsule 20 mg  20 mg Oral Daily Cristofano, Paul A, MD   20 mg at 09/23/20 1010   hydrOXYzine (ATARAX/VISTARIL) tablet 25 mg  25 mg Oral TID PRN Rozetta Nunnery, NP   25 mg at 09/20/20 2023   LORazepam (ATIVAN) tablet 1 mg  1 mg Oral Q6H PRN Sharma Covert, MD   1 mg at 09/22/20 2055   magnesium hydroxide (MILK OF MAGNESIA) suspension 30 mL  30 mL Oral Daily PRN Lindon Romp A, NP   30 mL at 09/22/20 2007   melatonin tablet 3 mg  3 mg Oral QHS PRN Sharma Covert, MD   3 mg at 09/22/20 2055   metoprolol succinate (TOPROL-XL) 24 hr tablet 100 mg  100 mg Oral Daily Sharma Covert, MD   100 mg at 09/23/20 1010   neomycin-bacitracin-polymyxin (NEOSPORIN) ointment packet   Topical BID Sharma Covert, MD   Given at 09/23/20 1009   OLANZapine (ZYPREXA) tablet 2.5 mg  2.5 mg Oral QHS Cristofano, Paul A, MD   2.5 mg at 09/22/20 2055   pantoprazole (PROTONIX) EC tablet 40 mg  40 mg Oral Daily Sharma Covert, MD   40 mg at  09/23/20 1010   PTA Medications: Medications Prior to Admission  Medication Sig Dispense Refill Last Dose   COLLAGEN PO Take 1 capsule by mouth daily.      metoprolol succinate (TOPROL-XL) 100 MG 24 hr tablet Take 100 mg by mouth in the morning. Take with or immediately following a meal.      metoprolol tartrate (LOPRESSOR) 50 MG tablet Take 50 mg by mouth in the morning and at bedtime.      ondansetron (ZOFRAN-ODT) 4 MG disintegrating tablet Take 4 mg by mouth every 8 (eight) hours as needed for nausea or vomiting (dissolve orally).       Patient Stressors: Financial difficulties Health problems Loss of Dad Medication change or noncompliance  Patient Strengths: Capable of independent living Communication skills Supportive family/friends  Treatment Modalities: Medication Management, Group therapy, Case management,  1 to 1 session with clinician, Psychoeducation, Recreational therapy.   Physician Treatment Plan for Primary Diagnosis: <principal problem not specified> Long Term Goal(s): Improvement in symptoms so as ready for discharge Improvement in symptoms so as ready for discharge   Short Term Goals: Ability to identify changes in lifestyle to reduce recurrence of condition will improve Ability to verbalize feelings will improve Ability to disclose and discuss suicidal ideas Ability to demonstrate self-control  will improve Ability to identify and develop effective coping behaviors will improve Ability to maintain clinical measurements within normal limits will improve Ability to identify changes in lifestyle to reduce recurrence of condition will improve Ability to verbalize feelings will improve Ability to disclose and discuss suicidal ideas Ability to demonstrate self-control will improve Ability to identify and develop effective coping behaviors will improve Ability to maintain clinical measurements within normal limits will improve  Medication Management: Evaluate  patient's response, side effects, and tolerance of medication regimen.  Therapeutic Interventions: 1 to 1 sessions, Unit Group sessions and Medication administration.  Evaluation of Outcomes: Progressing  Physician Treatment Plan for Secondary Diagnosis: Active Problems:   Severe recurrent major depression without psychotic features ([place])   Recurrent major depression-severe ([place])  Long Term Goal(s): Improvement in symptoms so as ready for discharge Improvement in symptoms so as ready for discharge   Short Term Goals: Ability to identify changes in lifestyle to reduce recurrence of condition will improve Ability to verbalize feelings will improve Ability to disclose and discuss suicidal ideas Ability to demonstrate self-control will improve Ability to identify and develop effective coping behaviors will improve Ability to maintain clinical measurements within normal limits will improve Ability to identify changes in lifestyle to reduce recurrence of condition will improve Ability to verbalize feelings will improve Ability to disclose and discuss suicidal ideas Ability to demonstrate self-control will improve Ability to identify and develop effective coping behaviors will improve Ability to maintain clinical measurements within normal limits will improve     Medication Management: Evaluate patient's response, side effects, and tolerance of medication regimen.  Therapeutic Interventions: 1 to 1 sessions, Unit Group sessions and Medication administration.  Evaluation of Outcomes: Progressing   RN Treatment Plan for Primary Diagnosis: <principal problem not specified> Long Term Goal(s): Knowledge of disease and therapeutic regimen to maintain health will improve  Short Term Goals: Ability to remain free from injury will improve, Ability to demonstrate self-control, Ability to participate in decision making will improve, Ability to verbalize feelings will improve, Ability to disclose  and discuss suicidal ideas and Ability to identify and develop effective coping behaviors will improve  Medication Management: RN will administer medications as ordered by provider, will assess and evaluate patient's response and provide education to patient for prescribed medication. RN will report any adverse and/or side effects to prescribing provider.  Therapeutic Interventions: 1 on 1 counseling sessions, Psychoeducation, Medication administration, Evaluate responses to treatment, Monitor vital signs and CBGs as ordered, Perform/monitor CIWA, COWS, AIMS and Fall Risk screenings as ordered, Perform wound care treatments as ordered.  Evaluation of Outcomes: Progressing   LCSW Treatment Plan for Primary Diagnosis: <principal problem not specified> Long Term Goal(s): Safe transition to appropriate next level of care at discharge, Engage patient in therapeutic group addressing interpersonal concerns.  Short Term Goals: Engage patient in aftercare planning with referrals and resources, Increase ability to appropriately verbalize feelings, Increase emotional regulation, Facilitate acceptance of mental health diagnosis and concerns, Facilitate patient progression through stages of change regarding substance use diagnoses and concerns and Increase skills for wellness and recovery  Therapeutic Interventions: Assess for all discharge needs, 1 to 1 time with Social worker, Explore available resources and support systems, Assess for adequacy in community support network, Educate family and significant other(s) on suicide prevention, Complete Psychosocial Assessment, Interpersonal group therapy.  Evaluation of Outcomes: Progressing   Progress in Treatment: Attending groups: Yes. Participating in groups: Yes. Taking medication as prescribed: Yes. Toleration medication: Yes. Family/Significant other  contact made: Yes, individual(s) contacted:  with mother Patient understands diagnosis: No. Discussing  patient identified problems/goals with staff: Yes. Medical problems stabilized or resolved: Yes. Denies suicidal/homicidal ideation: Yes. Issues/concerns per patient self-inventory: No.   New problem(s) identified: No, Describe:  None   New Short Term/Long Term Goal(s): medication stabilization, elimination of SI thoughts, development of comprehensive mental wellness plan.   Patient Goals:  Pt did not attend  Discharge Plan or Barriers:  Patient is currently on [facility] wait list. Patient is to follow up at [facility] for outpatient therapy and medication management.  Reason for Continuation of Hospitalization: Anxiety Depression Medication stabilization Suicidal ideation  Estimated Length of Stay: 3 to 5 days   Attendees: Patient: 09/23/2020   Physician:  09/23/2020   Nursing:  09/23/2020   RN Care Manager: 09/23/2020   Social Worker: Darletta Moll, LCSW 09/23/2020   Recreational Therapist:  09/23/2020   Other:  09/23/2020   Other:  09/23/2020   Other: 09/23/2020     Scribe for Treatment Team: Vassie Moselle, LCSW 09/23/2020 10:19 AM

## 2020-09-23 NOTE — Progress Notes (Signed)
Pt stated she was doing "amazing". Pt has insight into her future and Tx. Pt stated she was dealing with a lot of things going on at the same time. 1:1 time spent talking with writer about past, present and future and pt appeared hopeful.      09/23/20 2120  Psych Admission Type (Psych Patients Only)  Admission Status Involuntary  Psychosocial Assessment  Patient Complaints None  Eye Contact Fair  Facial Expression Anxious  Affect Appropriate to circumstance  Speech Logical/coherent  Interaction Assertive  Motor Activity Slow  Appearance/Hygiene Improved  Behavior Characteristics Cooperative  Mood Pleasant  Thought Process  Coherency WDL  Content WDL  Delusions None reported or observed  Perception WDL  Hallucination None reported or observed  Judgment Poor  Confusion None  Danger to Self  Current suicidal ideation? Denies  Self-Injurious Behavior No self-injurious ideation or behavior indicators observed or expressed   Agreement Not to Harm Self Yes  Description of Agreement verbally agrees to notify staff immediately for any thoughts of hurting herself or anyone else  Danger to Others  Danger to Others None reported or observed

## 2020-09-23 NOTE — Progress Notes (Signed)
[facility] 1:1 Observation Documentation  For the first (8) hours following discontinuation of 1:1 precautions, a progress note entry by nursing staff should be documented at least every 2 hours, reflecting the patient's behavior, condition, mood, and conversation.  Use the progress notes for additional entries.  Time 1:1 discontinued:  1300  Patient's Behavior:  Patient is calm and appropriate.  Patient's Condition:  Patient affect and mood is pleasant.  Patient's Conversation:  Interacting with staff in the dayroom.  Coralyn Mark Master Touchet 09/23/2020, 5:13 PM

## 2020-09-23 NOTE — Progress Notes (Signed)
Patient ID: Mackenzie Whitaker, female   DOB: 09/28/1990, 30 y.o.   MRN: [id number]    [facility] MD Progress Note 09/23/2020 1:57 PM Mackenzie Whitaker  MRN:  [id number]  Admission Information:  Mackenzie Whitaker is a 30 year old female with a past psychiatric history significant for depression, posttraumatic stress disorder, suspected borderline personality disorder who was admitted on 09/13/2020 after a self-inflicted injury for suicide attempt.    Today's note:   Patient seen in the day room watching television.  She was also seen interacting with the group appropriately.  When approached, patient denies any suicidal or homicidal ideation.  She reports feeling better and request to be removed from close observation.  Additionally she requests her stitches be removed and inquires about discharge planning.  Patient denies any suicidal ideation or audiovisual hallucinations.  She reports feeling much better on her current regimen.  She is denying any side effects of the Prozac or Zyprexa medication and is requesting discharge soon as possible.  Patient was informed that she will be discharged when the appropriate safe discharge planning can be performed, to which she expressed agreement.      Principal Problem: <principal problem not specified> Diagnosis: Active Problems:   Severe recurrent major depression without psychotic features (HCC)   Recurrent major depression-severe (HCC)  Total Time spent with patient: 20 minutes  Past Psychiatric History: See admission H&P  Past Medical History:  Past Medical History:  Diagnosis Date  . Diabetes mellitus ([place])   . Fatty liver disease, nonalcoholic   . GAD (generalized anxiety disorder)   . History of cardioversion   . HTN (hypertension)   . MDD (major depressive disorder)   . PTSD (post-traumatic stress disorder)   . Sinus tachycardia     Past Surgical History:  Procedure Laterality Date  . APPENDECTOMY    . SKIN CANCER EXCISION     Family  History: History reviewed. No pertinent family history. Family Psychiatric  History: See admission H&P Social History:  Social History   Substance and Sexual Activity  Alcohol Use Not Currently     Social History   Substance and Sexual Activity  Drug Use Not Currently    Social History   Socioeconomic History  . Marital status: Single    Spouse name: Not on file  . Number of children: Not on file  . Years of education: Not on file  . Highest education level: Not on file  Occupational History  . Not on file  Tobacco Use  . Smoking status: Never Smoker  . Smokeless tobacco: Never Used  Vaping Use  . Vaping Use: Never used  Substance and Sexual Activity  . Alcohol use: Not Currently  . Drug use: Not Currently  . Sexual activity: Not Currently  Other Topics Concern  . Not on file  Social History Narrative  . Not on file   Social Determinants of Health   Financial Resource Strain: Not on file  Food Insecurity: Not on file  Transportation Needs: Not on file  Physical Activity: Not on file  Stress: Not on file  Social Connections: Not on file   Additional Social History:    Sleep: Good  Appetite:  Good  Current Medications: Current Facility-Administered Medications  Medication Dose Route Frequency Provider Last Rate Last Admin  . acetaminophen (TYLENOL) tablet 650 mg  650 mg Oral Q6H PRN Lindon Romp A, NP   650 mg at 09/22/20 1707  . alum & mag hydroxide-simeth (MAALOX/MYLANTA) 200-200-20 MG/5ML suspension 30 mL  30  mL Oral Q4H PRN Lindon Romp A, NP      . ciprofloxacin-hydrocortisone (CIPRO HC OTIC) 0.2-1 % OTIC (EAR) suspension 3 drop  3 drop Right EAR BID Jene Huq, Dorene Ar, MD   3 drop at 09/23/20 1009  . ferrous sulfate tablet 325 mg  325 mg Oral BID WC Sharma Covert, MD   325 mg at 09/23/20 1010  . FLUoxetine (PROZAC) capsule 20 mg  20 mg Oral Daily Darren Nodal A, MD   20 mg at 09/23/20 1010  . hydrOXYzine (ATARAX/VISTARIL) tablet 25 mg  25 mg  Oral TID PRN Rozetta Nunnery, NP   25 mg at 09/20/20 2023  . LORazepam (ATIVAN) tablet 1 mg  1 mg Oral Q6H PRN Sharma Covert, MD   1 mg at 09/22/20 2055  . magnesium hydroxide (MILK OF MAGNESIA) suspension 30 mL  30 mL Oral Daily PRN Lindon Romp A, NP   30 mL at 09/22/20 2007  . melatonin tablet 3 mg  3 mg Oral QHS PRN Sharma Covert, MD   3 mg at 09/22/20 2055  . metoprolol succinate (TOPROL-XL) 24 hr tablet 100 mg  100 mg Oral Daily Sharma Covert, MD   100 mg at 09/23/20 1010  . neomycin-bacitracin-polymyxin (NEOSPORIN) ointment packet   Topical BID Sharma Covert, MD   Given at 09/23/20 1009  . OLANZapine (ZYPREXA) tablet 2.5 mg  2.5 mg Oral QHS Emylee Decelle, Dorene Ar, MD   2.5 mg at 09/22/20 2055  . pantoprazole (PROTONIX) EC tablet 40 mg  40 mg Oral Daily Sharma Covert, MD   40 mg at 09/23/20 1010    Lab Results:  No results found for this or any previous visit (from the past 48 hour(s)).  Blood Alcohol level:  Lab Results  Component Value Date   ETH <10 95/62/1308    Metabolic Disorder Labs: Lab Results  Component Value Date   HGBA1C 5.0 09/16/2020   MPG 96.8 09/16/2020   MPG 99.67 09/14/2020   No results found for: PROLACTIN Lab Results  Component Value Date   CHOL 64 09/16/2020   TRIG 41 09/16/2020   HDL 27 (L) 09/16/2020   CHOLHDL 2.4 09/16/2020   VLDL 8 09/16/2020   LDLCALC 29 09/16/2020   LDLCALC 33 09/14/2020    Physical Findings: AIMS: Facial and Oral Movements Muscles of Facial Expression: None, normal Lips and Perioral Area: None, normal Jaw: None, normal Tongue: None, normal,Extremity Movements Upper (arms, wrists, hands, fingers): None, normal Lower (legs, knees, ankles, toes): None, normal, Trunk Movements Neck, shoulders, hips: None, normal, Overall Severity Severity of abnormal movements (highest score from questions above): None, normal Incapacitation due to abnormal movements: None, normal Patient's awareness of abnormal  movements (rate only patient's report): No Awareness, Dental Status Current problems with teeth and/or dentures?: No Does patient usually wear dentures?: No  CIWA:  CIWA-Ar Total: 3 COWS:     Musculoskeletal: Strength & Muscle Tone: within normal limits Gait & Station: normal Patient leans: N/A  Psychiatric Specialty Exam: Physical Exam Vitals and nursing note reviewed.  Constitutional:      Appearance: She is obese.  HENT:     Head: Normocephalic and atraumatic.  Pulmonary:     Effort: Pulmonary effort is normal.  Musculoskeletal:        General: Normal range of motion.     Cervical back: Normal range of motion.  Neurological:     General: No focal deficit present.     Mental Status:  She is alert and oriented to person, place, and time.     Review of Systems  Constitutional: Negative.   HENT: Negative for ear pain, rhinorrhea, sneezing and sore throat.   Genitourinary: Negative.   Musculoskeletal: Negative.   Neurological: Negative.     Blood pressure 113/68, pulse 90, temperature (!) 97.4 F (36.3 C), temperature source Oral, resp. rate 20, height 5' 5.5" (1.664 m), weight 130.2 kg, SpO2 100 %.Body mass index is 47.03 kg/m.  General Appearance: Casual, dressed in blue hospital gown, fair hygiene  Eye Contact:  Good  Speech:  Clear and Coherent and Normal Rate  Volume:  Normal  Mood:  Euthymic, pleasant on approach  Affect:  Congruent  Thought Process:  Coherent and Descriptions of Associations: Intact  Orientation:  Full (Time, Place, and Person)  Thought Content:  Logical  Suicidal Thoughts:  Intermittently, denies today  Homicidal Thoughts:  No, denies  Memory:  Immediate;   Fair Recent;   Fair Remote;   Fair  Judgement:  Poor  Insight:  Lacking  Psychomotor Activity:  Psychomotor Retardation  Concentration:  Concentration: Fair and Attention Span: Fair  Recall:  [employer] of Knowledge:  Fair  Language:  Good  Akathisia:  Negative  Handed:  Right   AIMS (if indicated):     Assets:  Desire for Improvement Housing Resilience Social Support  ADL's:  Intact  Cognition:  WNL  Sleep:  Number of Hours: 6.5     Treatment Plan Summary: Daily contact with patient to assess and evaluate symptoms and progress in treatment, Medication management and Plan : Patient is seen and examined.  Patient is a 30 year old female with the above-stated past psychiatric history who is seen in follow-up.    Patient is doing better and reports resolution of SI.  Will start discharge planning.  Diagnosis: 1. Major depression, recurrent, severe with psychotic features 2. Posttraumatic stress disorder. 3. Probable borderline personality disorder. 4. Unspecified tachycardia (with EKGs here showing a sinus tachycardia). 5. History of hypertension. 6. History of hepatic steatosis with mildly elevated liver function enzymes. 7. Probable diabetes    Plan:  1. Continue with zyprexa 2.5mg  qhs for mood stabilization   2. Continue  Prozac 20mg  po qam 3. Continue Toprol-XL 100 mg p.o. daily for hypertension and tachycardia. 4. Continue Neosporin ointment to the self-inflicted wound. 5. Continue Zyprexa Zydis 5 mg p.o. or Zyprexa 5 mg IM every 6 hours as needed agitation. 6. Continue Protonix 40 mg p.o. daily for gastric protection. 7. Melatonin 3 mg p.o. nightly as needed insomnia.   Dixie Dials, MD-PMHNP-BC 09/23/2020, 1:57 PM

## 2020-09-23 NOTE — [facility] Group Notes (Signed)
[facility] LCSW Group Therapy  09/23/2020 2:56 PM  Type of Therapy:  Movement Therapy  Participation Level:  Active  Participation Quality:  Appropriate  Affect:  Appropriate  Cognitive:  Appropriate  Modes of Intervention:  Activity  Summary of Progress/Problems: Patient attended and interacted with peers appropriately during group.   Mackenzie Whitaker A Brycen Bean 09/23/2020, 2:56 PM

## 2020-09-23 NOTE — Progress Notes (Signed)
Patient ID: Mackenzie Whitaker, female   DOB: 1991-06-25, 30 y.o.   MRN: [id number] 1:1 notes  09/23/2020 @ 0600  D: Patient in bed sleeping. Respiration regular and unlabored. No sign of distress noted at this time A: 15 minutes checks continue for safety R: Patient remains safe.

## 2020-09-23 NOTE — Progress Notes (Signed)
Closed observation: Patient visible in the dayroom, attended group and participated.  Medication given as prescribed.  Denies sucidal thoughts, auditory and visual hallucinations.  Routine safety checks maintained.  Patient is safe on the unit.

## 2020-09-23 NOTE — Progress Notes (Signed)
Adult Psychoeducational Group Note  Date:  09/23/2020 Time:  10:01 PM  Group Topic/Focus:  Wrap-Up Group:   The focus of this group is to help patients review their daily goal of treatment and discuss progress on daily workbooks.  Participation Level:  Active  Participation Quality:  Appropriate  Affect:  Appropriate  Cognitive:  Appropriate  Insight: Appropriate  Engagement in Group:  Developing/Improving  Modes of Intervention:  Discussion  Additional Comments:  Pt stated her goal for today was to focus on her treatment plan and talk with her doctor about her discharge plan. Pt stated she felt she accomplished her goals today. Pt stated the plan is for her to discharged on Tuesday (09/24/2020). Pt stated she talk with her doctor and social worker, regarding her care today. Pt stated she took all her medication today from her providers. Pt stated been able to contact her mother and sister today improved her overall day. Pt stated her relationship with her family and support team has improved since she was admitted here. Pt rated her overall day a 10 today. Pt stated she felt better about herself today. Pt stated her appetite was pretty good today and she attend all meals today. Pt stated her sleep last night was good. Pt stated the goal for tonight was to get some rest. Pt stated she was in no physical pain. Pt deny auditory or visual hallucinations. Pt denies thoughts of harming herself or others. Pt stated she would alert staff if anything changes.  [name] 09/23/2020, 10:01 PM

## 2020-09-23 NOTE — Progress Notes (Signed)
[facility] 1:1 Observation Documentation  For the first (8) hours following discontinuation of 1:1 precautions, a progress note entry by nursing staff should be documented at least every 2 hours, reflecting the patient's behavior, condition, mood, and conversation.  Use the progress notes for additional entries.  Time 1:1 discontinued:  1300  Patient's Behavior:  Patient is calm and appropriate on the unit.  Patient's Condition:  Patient is pleasant.  Sutures to both arms removed.      Patient's Conversation:  Patient in the dayroom interacting with staff and watching TV.     Coralyn Mark Tyffani Foglesong 09/23/2020, 5:15 PM

## 2020-09-24 ENCOUNTER — Encounter (HOSPITAL_COMMUNITY): Payer: Self-pay | Admitting: Emergency Medicine

## 2020-09-24 MED ORDER — OLANZAPINE 2.5 MG PO TABS: 2.5000 mg | ORAL_TABLET | Freq: Every day | ORAL | 0 refills | Status: AC

## 2020-09-24 MED ORDER — METOPROLOL SUCCINATE ER 100 MG PO TB24
100.0000 mg | ORAL_TABLET | Freq: Every day | ORAL | 0 refills | Status: DC
Start: 2020-09-25 — End: 2020-09-24

## 2020-09-24 MED ORDER — FLUOXETINE HCL 20 MG PO CAPS
20.0000 mg | ORAL_CAPSULE | Freq: Every day | ORAL | 0 refills | Status: DC
Start: 2020-09-25 — End: 2020-09-24

## 2020-09-24 MED ORDER — HYDROXYZINE HCL 25 MG PO TABS: 25.0000 mg | ORAL_TABLET | Freq: Three times a day (TID) | ORAL | 0 refills | Status: DC | PRN

## 2020-09-24 MED ORDER — METOPROLOL SUCCINATE ER 100 MG PO TB24: 100.0000 mg | ORAL_TABLET | Freq: Every day | ORAL | 0 refills | Status: AC

## 2020-09-24 MED ORDER — OLANZAPINE 2.5 MG PO TABS: 2.5000 mg | ORAL_TABLET | Freq: Every day | ORAL | 0 refills | Status: DC

## 2020-09-24 MED ORDER — HYDROXYZINE HCL 25 MG PO TABS: 25.0000 mg | ORAL_TABLET | Freq: Three times a day (TID) | ORAL | 0 refills | Status: AC | PRN

## 2020-09-24 MED ORDER — FLUOXETINE HCL 20 MG PO CAPS: 20.0000 mg | ORAL_CAPSULE | Freq: Every day | ORAL | 0 refills | Status: AC

## 2020-09-24 NOTE — Discharge Summary (Signed)
Physician Discharge Summary Note  Patient:  Mackenzie Whitaker Whitaker is an 30 y.o., female MRN:  [id number] DOB:  October 03, 1990 Patient phone:  [phone] (home)  Patient address:   [place] [address],  Total Time spent with patient: 30 minutes  Date of Admission:  09/13/2020 Date of Discharge: 09/24/2020  Reason for Admission:  Severe recurrent Major Depression without psychotic features. Post suicide attempt  Principal Problem: Severe recurrent major depression without psychotic features [facility]) Discharge Diagnoses: Principal Problem:   Severe recurrent major depression without psychotic features ([place]) Active Problems:   Recurrent major depression-severe ([place])   Past Psychiatric History: See H&P  Past Medical History:  Past Medical History:  Diagnosis Date  . Cancer ([place])    melanoma  . Diabetes mellitus ([place])   . Fatty liver disease, nonalcoholic   . GAD (generalized anxiety disorder)   . History of cardioversion   . HTN (hypertension)   . MDD (major depressive disorder)   . PTSD (post-traumatic stress disorder)   . Sinus tachycardia     Past Surgical History:  Procedure Laterality Date  . ABLATION    . APPENDECTOMY    . SKIN CANCER EXCISION     Family History:  Family History  Problem Relation Age of Onset  . Cancer Father   . Hypertension Father   . Diabetes Father   . High Cholesterol Father    Family Psychiatric  History: See H&P Social History:  Social History   Substance and Sexual Activity  Alcohol Use Not Currently     Social History   Substance and Sexual Activity  Drug Use Not Currently    Social History   Socioeconomic History  . Marital status: Single    Spouse name: Not on file  . Number of children: Not on file  . Years of education: Not on file  . Highest education level: Not on file  Occupational History  . Not on file  Tobacco Use  . Smoking status: Never Smoker  . Smokeless tobacco: Never Used  Vaping Use  .  Vaping Use: Never used  Substance and Sexual Activity  . Alcohol use: Not Currently  . Drug use: Not Currently  . Sexual activity: Not Currently  Other Topics Concern  . Not on file  Social History Narrative   ** Merged History Encounter **       Social Determinants of Health   Financial Resource Strain: Not on file  Food Insecurity: Not on file  Transportation Needs: Not on file  Physical Activity: Not on file  Stress: Not on file  Social Connections: Not on file    Hospital Course: (Per MD Admission Note, 09/13/2020): "Patient is seen and examined. Patient is a 30 year old female with a reported past psychiatric history significant for major depression as well as posttraumatic stress disorder who presented to the [facility] emergency department on 09/12/2020 after suicide attempt with cutting her bilateral antecubital space with a razor. The emergency room note after EMS had picked her up stated it was a box cutter, but the patient corrected me today and told me it was a razor blade. She had reportedly lost a significant amount of blood and she received a transfusion of 2 units of packed red blood cells as well as 1 L of IV fluid. Her laboratories revealed that her hemoglobin and hematocrit prior to transfusion was 13.9 and 42.6. Shortly after that it was still 13.6 and 40.0 respectively. Surgicalconsultation was obtained secondary for  concern of hemorrhagic shock as well as tachycardia. She reported to the surgical consultant that she takes metoprolol for idiopathic sinus tachycardia but has not had that in some time, and in the past has had as many as 6 cardioversions. The last was approximately 6 years ago. She reported at that time that her heart rate had gone up to as high as 220, and she had actually suffered a syncopal episode at that time. Surgical closure of the wounds were recommended, and was not felt to require trauma admission. Seen by the  comprehensive clinical assessment team and at that time she was unable to recall what time the injury occurred. She reported that she was very scared of her husband because he was trying to find her and harm her. She apparently has been moving around quite a bit. She recently moved from [place] to [place] in January. She also had previously lived in [place] as well as [place]. She stated in [place] she had been hospitalized approximately 6 times for psychiatric reasons,and per her report 16 suicide attempts in the past. She had apparently been in one hospital for at least 6 months by her description. She stated she had been hospitalized in [place] because of her suicidal ideation. She stated that she had been treated for PTSD, depression and anxiety in the past. She reported that she had been treated with Zoloft during the course of her hospitalizations, but that "made me suicidal". She was recommended for admission to the psychiatric facility. She arrived in the a.m. of 09/13/2020. The first note in the chart from our staff was at approximately 6 AM."  After above assessment and subsequent admission, the Mackenzie Whitaker Whitaker was admitted to [facility] for treatment of mood stabilization and medications to target her depression and suicidal thoughts. The course of Mackenzie Whitaker's hospitalization was significant in that shortly after admission to the [facility] patient manipulated her wound, losing significant enough blood to require transport back to the emergency room via EMS for medical evaluation. She was evaluated and treated at [facility] ED and returned to the [facility] the same day. She was immediately placed on 1:1 observation for safety. She continued to endorse suicidal ideations and initially would not contract for safety. She remained guarded and offered minimal participation in groups or one-on-one conversations with staff. Patient removed sutures from her wound on 09-14-20 and was again sent to [facility] ED  for evaluation and treatment. She then returned to [facility] after and was placed on 1:1 observation for safety. She was not engaging in groups and had minimal interaction with staff.  She was compliant with medications. Patient remained on 1:1 observation with no further incidents of self-harm or unsafe behaviors. Over the course of hospitalization her mood brightened and she engaged with staff and peers in groups. She was taken off 1:1 observation on 09-22-2020, after several days of demonstration of self-control, improved mood, absence of self-harm, and active participation in treatment. Upon discharge, patient denies suicidal ideation or thoughts of self-harm. She denies anxiety, denies homicidal ideations. Denies AV hallucinations. Does not appear to be responding to internal stimuli. epression or anxiety. Patient is observed by Probation officer preparing for discharge. Her mood is bright and she states she is looking forward to going to [place] to live with her mother. Patient currently presents medically and physically stable for discharge.   Physical Findings: AIMS: Facial and Oral Movements Muscles of Facial Expression: None, normal Lips and Perioral Area: None, normal Jaw: None, normal Tongue: None,  normal,Extremity Movements Upper (arms, wrists, hands, fingers): None, normal Lower (legs, knees, ankles, toes): None, normal, Trunk Movements Neck, shoulders, hips: None, normal, Overall Severity Severity of abnormal movements (highest score from questions above): None, normal Incapacitation due to abnormal movements: None, normal Patient's awareness of abnormal movements (rate only patient's report): No Awareness, Dental Status Current problems with teeth and/or dentures?: No Does patient usually wear dentures?: No  CIWA:  CIWA-Ar Total: 3 COWS:     Musculoskeletal: Strength & Muscle Tone: within normal limits Gait & Station: normal Patient leans: N/A  Psychiatric Specialty Exam: SEE MD'S SRA Physical  Exam Vitals and nursing note reviewed.  Constitutional:      Appearance: Normal appearance.  Pulmonary:     Effort: Pulmonary effort is normal.  Musculoskeletal:     Cervical back: Normal range of motion.  Neurological:     Mental Status: She is alert and oriented to person, place, and time.     Review of Systems  Psychiatric/Behavioral: Positive for dysphoric mood (hx of. Stable at discharge), self-injury (Hx of, stable at discharge) and suicidal ideas (Hx of. stable at discharge). Negative for agitation.  All other systems reviewed and are negative.   Blood pressure 118/74, pulse 81, temperature (!) 97.4 F (36.3 C), temperature source Oral, resp. rate 20, height 5' 5.5" (1.664 m), weight 130.2 kg, SpO2 (!) 79 %.Body mass index is 47.03 kg/m.  Sleep:  Number of Hours: 7.25     Have you used any form of tobacco in the last 30 days? (Cigarettes, Smokeless Tobacco, Cigars, and/or Pipes): No  Has this patient used any form of tobacco in the last 30 days? (Cigarettes, Smokeless Tobacco, Cigars, and/or Pipes)  No  Blood Alcohol level:  Lab Results  Component Value Date   ETH <10 09/12/2020   ETH <10 10/62/6948    Metabolic Disorder Labs:  Lab Results  Component Value Date   HGBA1C 5.0 09/16/2020   MPG 96.8 09/16/2020   MPG 99.67 09/14/2020   No results found for: PROLACTIN Lab Results  Component Value Date   CHOL 64 09/16/2020   TRIG 41 09/16/2020   HDL 27 (L) 09/16/2020   CHOLHDL 2.4 09/16/2020   VLDL 8 09/16/2020   LDLCALC 29 09/16/2020   LDLCALC 33 09/14/2020    See Psychiatric Specialty Exam and Suicide Risk Assessment completed by Attending Physician prior to discharge.  Discharge destination:  Home  Is patient on multiple antipsychotic therapies at discharge:  No   Has Patient had three or more failed trials of antipsychotic monotherapy by history:  No  Recommended Plan for Multiple Antipsychotic Therapies: NA  Discharge Instructions    Diet - low  sodium heart healthy   Complete by: As directed    Increase activity slowly   Complete by: As directed    Increase activity slowly   Complete by: As directed        [place]. Go on 09/24/2020.   Specialty: Behavioral Health Why: You have a walk in appointment for therapy services on 09/24/20 at 7:45 am.  You also have a walk in appointment for medication management services on 10/08/20 at 7:45 am.  Walk in appointments are first come, first served and are held in person. Contact information: [place] [phone]              Follow-up recommendations:Take all of your medications as prescribed   Report any side effects  to your outpatient provider promptly.  Refrain from alcohol and illegal drug use while taking medications.  In the case of emergency call 911 or go to the nearest emergency department for evaluation/treatmentFollow up with outpatient provider for all your medical care needs. Activity as tolerated. Diet as recommended by your outpatient provider.    Comments:  Prescriptions given to patient:  Prozac 20 mg daily, hydroxyzine 25 mg TID PRN anxiety, Toprol-XL 100mg  daily in the morning with a meal, Zyprexa 2.5 mg at bedtime. with follow-up instructions provided by social work.   Signed: Sherlon Handing, NP 09/24/2020, 3:46 PM

## 2020-09-24 NOTE — Progress Notes (Signed)
Pt discharged to lobby. Pt was stable and appreciative at that time. All papers, samples and prescriptions were given and valuables returned. Verbal understanding expressed. Denies SI/HI and A/VH. Pt given opportunity to express concerns and ask questions.  
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
## 2020-09-24 NOTE — Progress Notes (Signed)
Recreation Therapy Notes  Date: 2.22.22 Time: 1000 Location: 500 Hall Dayroom  Group Topic: Coping Skills  Goal Area(s) Addresses:  Patient will identify positive coping skills. Patient will identify benefit of using coping skills post d/c.  Behavioral Response: Engaged  Intervention: Worksheet, magazines, glue sticks, music  Activity: Radiographer, therapeutic.  Patients were given a worksheet broken down into diversions, social, cognitive, tension releasers and physical.  Patients were to look through the magazines and identify at least two coping skills for each category.  Patients would then glue those coping skills under the corresponding category.  Education: Radiographer, therapeutic, Dentist.   Education Outcome: Acknowledges understanding/In group clarification offered/Needs additional education.   Clinical Observations/Feedback: Pt worked quietly to complete her worksheet.  Patient was bright and pleasant.  Pt left early for discharge.   Victorino Sparrow, LRT/CTRS   Victorino Sparrow A 09/24/2020 11:33 AM

## 2020-09-24 NOTE — Progress Notes (Signed)
  [facility] Adult Case Management Discharge Plan :  Will you be returning to the same living situation after discharge:  No. Will be staying with mother At discharge, do you have transportation home?: No. Safe Transport will be arranged Do you have the ability to pay for your medications: No. Samples to be provided at discharge  Release of information consent forms completed and in the chart;  Patient's signature needed at discharge.  Patient to Follow up at:  [place]. Go on 09/24/2020.   Specialty: Behavioral Health Why: You have a walk in appointment for therapy services on 09/24/20 at 7:45 am.  You also have a walk in appointment for medication management services on 10/08/20 at 7:45 am.  Walk in appointments are first come, first served and are held in person. Contact information: [place] [phone]              Next level of care provider has access to [place] and Suicide Prevention discussed: Yes,  with mother  Have you used any form of tobacco in the last 30 days? (Cigarettes, Smokeless Tobacco, Cigars, and/or Pipes): No  Has patient been referred to the Quitline?: N/A patient is not a smoker  Patient has been referred for addiction treatment: N/A  Vassie Moselle, LCSW 09/24/2020, 10:12 AM

## 2020-09-24 NOTE — Progress Notes (Signed)
   09/24/20 0500  Sleep  Number of Hours 7.25

## 2020-10-03 ENCOUNTER — Inpatient Hospital Stay: Payer: Self-pay | Admitting: Internal Medicine

## 2022-07-27 IMAGING — CT CT ABD-PELV W/ CM
2 of 4 series · 16 of 46 positions shown, 18 images · IV contrast (OMNIPAQUE 300)
Comparison: None.

CLINICAL DATA: Nonlocalized abdominal pain

EXAM:
CT ABDOMEN AND PELVIS WITH CONTRAST
TECHNIQUE: Multidetector CT imaging of the abdomen and pelvis was performed
using the standard protocol following bolus administration of
intravenous contrast.
CONTRAST:  100mL OMNIPAQUE IOHEXOL 300 MG/ML  SOLN

[Series 2: axial st · axial · 0.90mm/px · z∈[+999,+1439]mm · 13 of 100 slices shown, 15 images]
[im 6/100  soft-tissue]
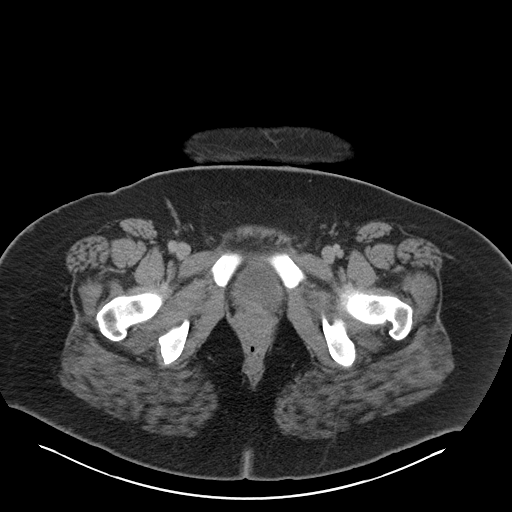
[im 6/100  bone]
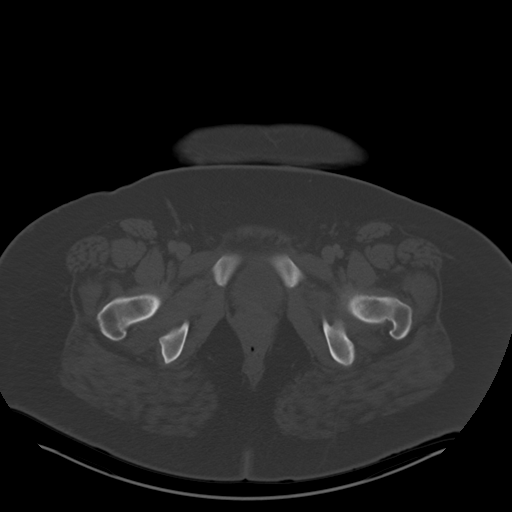
[im 12/100  soft-tissue]
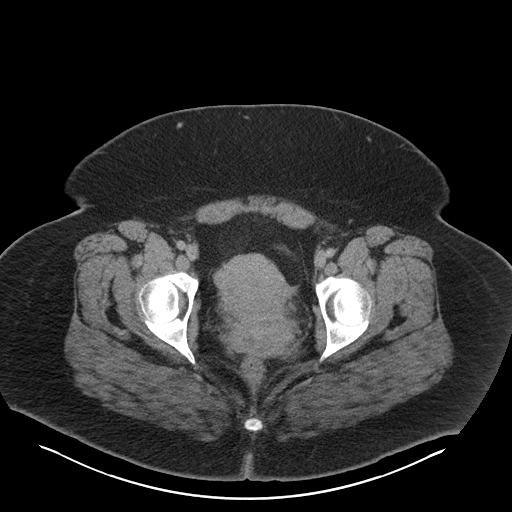
[im 24/100  soft-tissue]
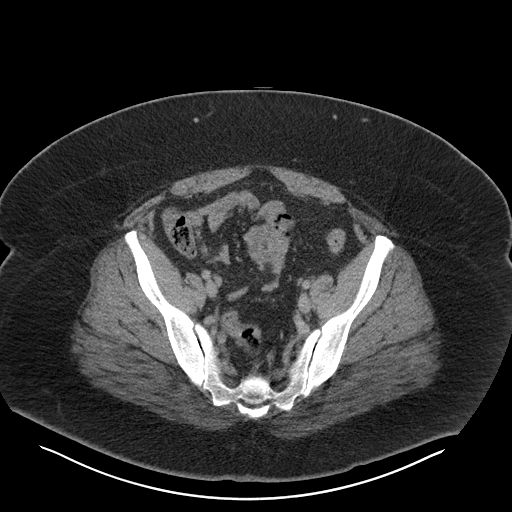
[im 30/100  soft-tissue]
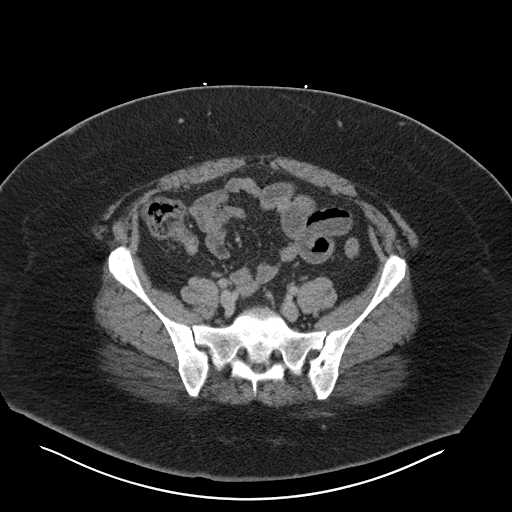
[im 35/100  soft-tissue]
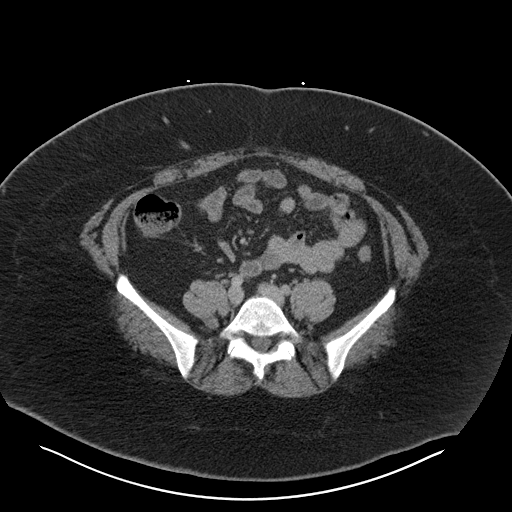
[im 41/100  soft-tissue]
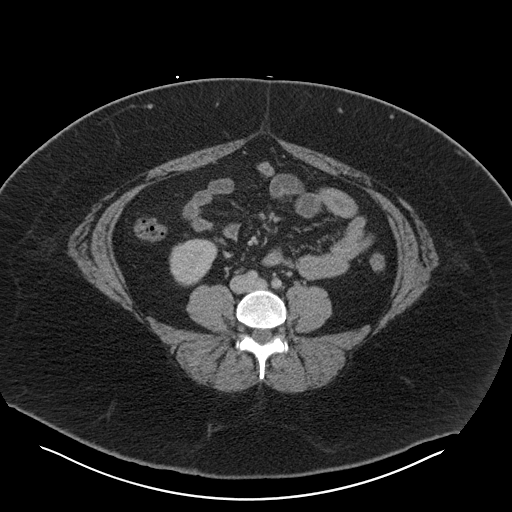
[im 53/100  soft-tissue]
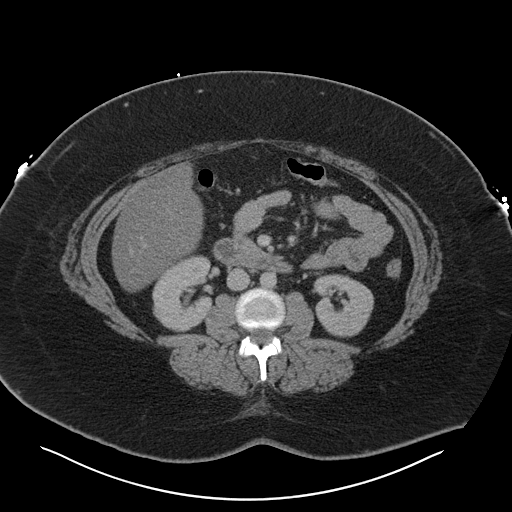
[im 59/100  soft-tissue]
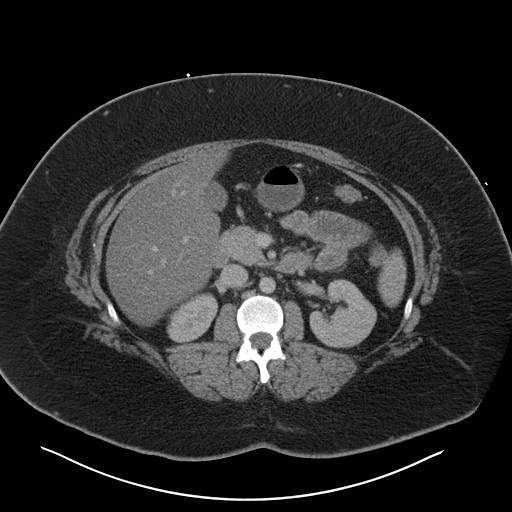
[im 65/100  soft-tissue]
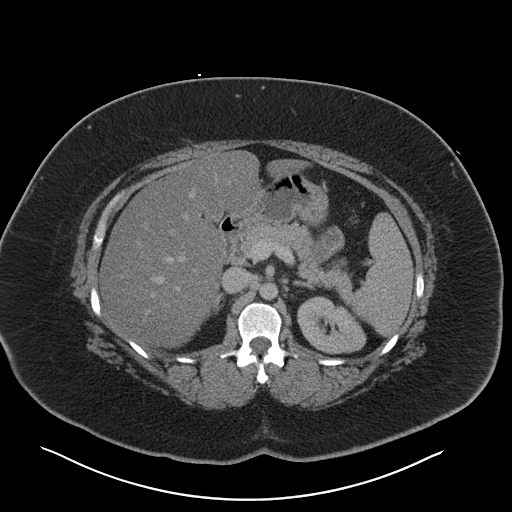
[im 65/100  bone]
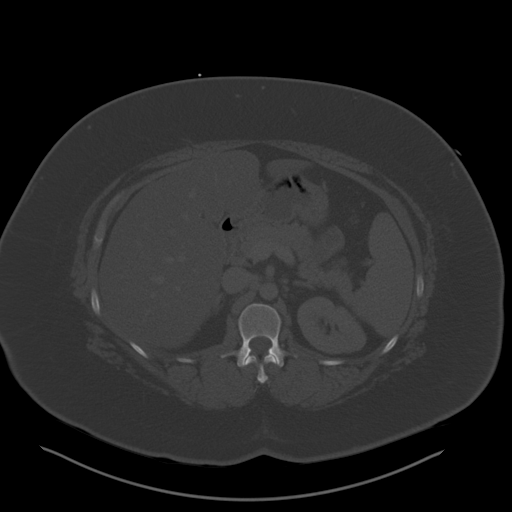
[im 70/100  soft-tissue]
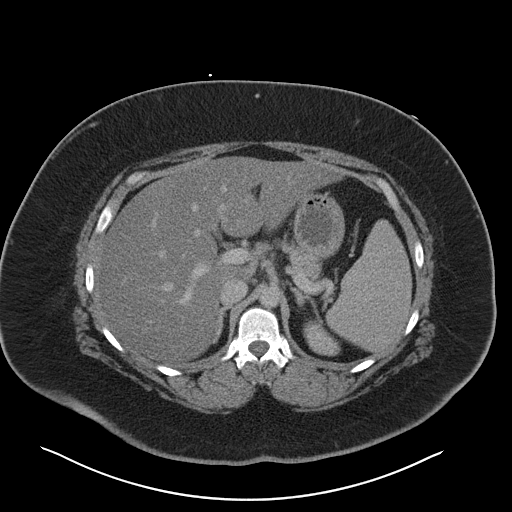
[im 76/100  soft-tissue]
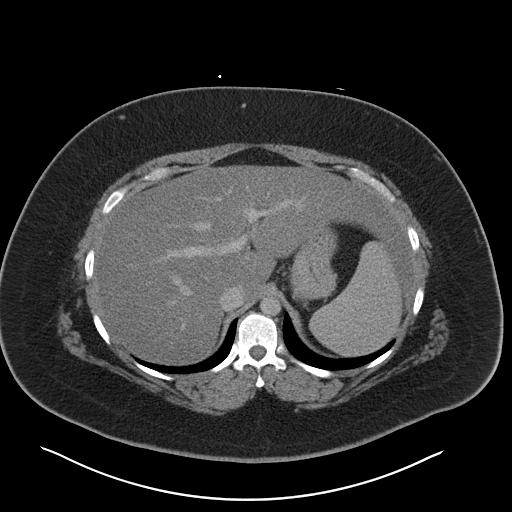
[im 88/100  soft-tissue]
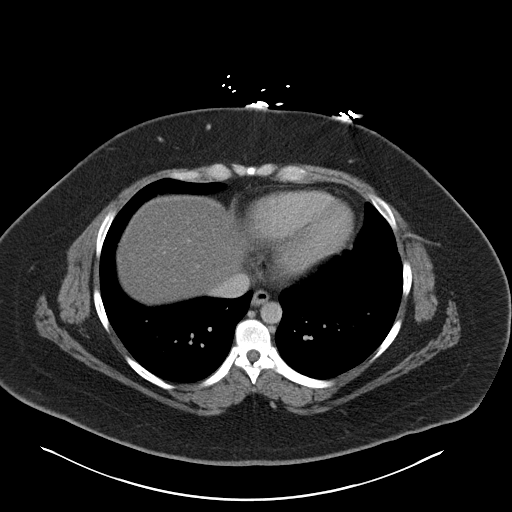
[im 94/100  soft-tissue]
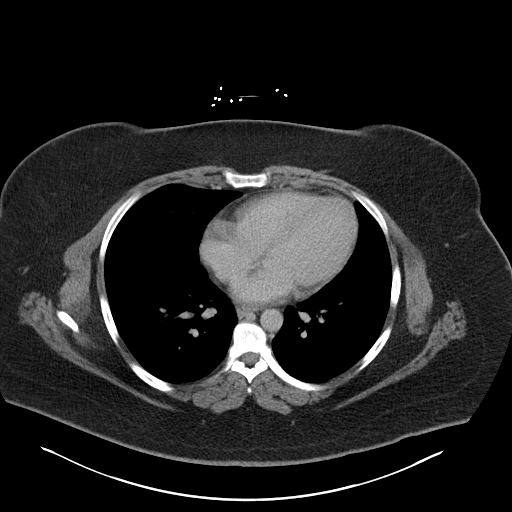

[Series 4: coronal st · coronal · 0.97mm/px · 3 of 183 slices shown]
[im 61/183  soft-tissue]
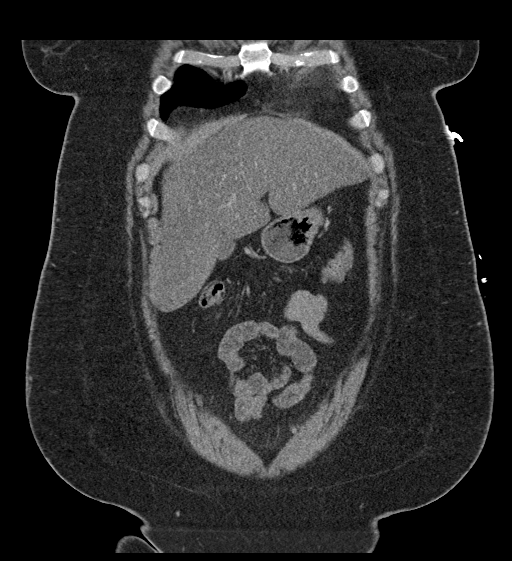
[im 81/183  soft-tissue]
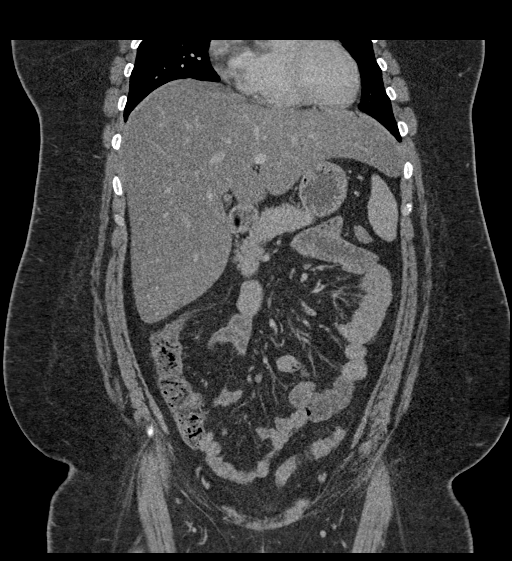
[im 102/183  soft-tissue]
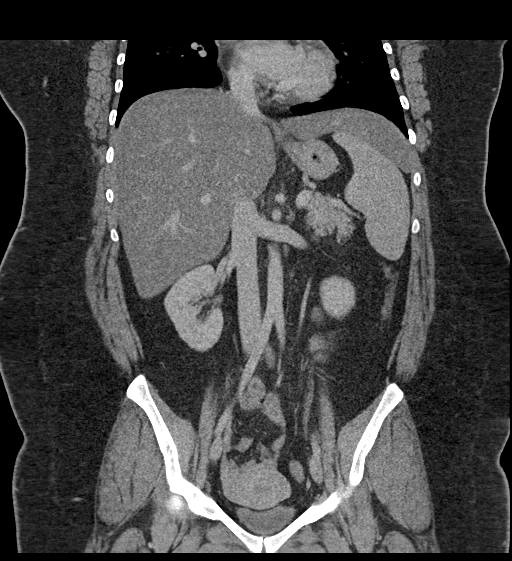

[16 of 46 positions shown; findings below may reference images not displayed]

FINDINGS: Lower chest: The visualized heart size within normal limits. No
pericardial fluid/thickening.

No hiatal hernia.

The visualized portions of the lungs are clear.

Hepatobiliary: There is diffuse low density seen throughout the
liver parenchyma. Mild hepatomegaly is noted. The main portal vein
is patent. No evidence of calcified gallstones, gallbladder wall
thickening or biliary dilatation.

Pancreas: Unremarkable. No pancreatic ductal dilatation or
surrounding inflammatory changes.

Spleen: Normal in size without focal abnormality.

Adrenals/Urinary Tract: Both adrenal glands appear normal. Punctate
calcifications are seen within the mid to lower pole of the left
kidney the largest measuring 2 mm within the lower pole. There is
also punctate calcifications seen within the lower pole of the right
kidney. No collecting system calculi are noted. Bladder is
unremarkable.

Stomach/Bowel: The stomach, small bowel, and colon are normal in
appearance. No inflammatory changes, wall thickening, or obstructive
findings.The patient is status post appendectomy.

Vascular/Lymphatic: There are no enlarged mesenteric,
retroperitoneal, or pelvic lymph nodes. No significant vascular
findings are present.

Reproductive: The uterus and adnexa are unremarkable.

Other: No evidence of abdominal wall mass or hernia.

Musculoskeletal: No acute or significant osseous findings.
IMPRESSION: Hepatic steatosis and mild hepatomegaly

Punctate nonobstructing bilateral renal calculi
